# Patient Record
Sex: Male | Born: 1945
Health system: Southern US, Community
[De-identification: ages and names within clinical notes are randomized; demographics above are authoritative.]

## PROBLEM LIST (undated history)

## (undated) DIAGNOSIS — Z8719 Personal history of other diseases of the digestive system: Secondary | ICD-10-CM

## (undated) DIAGNOSIS — G8929 Other chronic pain: Secondary | ICD-10-CM

## (undated) DIAGNOSIS — K635 Polyp of colon: Secondary | ICD-10-CM

## (undated) DIAGNOSIS — E7800 Pure hypercholesterolemia, unspecified: Secondary | ICD-10-CM

## (undated) DIAGNOSIS — M1990 Unspecified osteoarthritis, unspecified site: Secondary | ICD-10-CM

## (undated) DIAGNOSIS — F419 Anxiety disorder, unspecified: Secondary | ICD-10-CM

## (undated) DIAGNOSIS — M109 Gout, unspecified: Secondary | ICD-10-CM

## (undated) DIAGNOSIS — G43109 Migraine with aura, not intractable, without status migrainosus: Secondary | ICD-10-CM

## (undated) DIAGNOSIS — M549 Dorsalgia, unspecified: Secondary | ICD-10-CM

## (undated) DIAGNOSIS — R011 Cardiac murmur, unspecified: Secondary | ICD-10-CM

## (undated) HISTORY — PX: MEDIAL COLLATERAL LIGAMENT REPAIR, ELBOW: SHX2018

## (undated) HISTORY — PX: FOOT SURGERY: SHX648

## (undated) HISTORY — PX: EYE SURGERY: SHX253

## (undated) HISTORY — PX: NASAL ENDOSCOPY: SHX6577

## (undated) HISTORY — PX: KNEE SURGERY: SHX244

## (undated) HISTORY — PX: OTHER SURGICAL HISTORY: SHX169

---

## 1996-09-27 HISTORY — PX: ROTATOR CUFF REPAIR: SHX139

## 1998-11-07 ENCOUNTER — Ambulatory Visit (HOSPITAL_COMMUNITY): Admission: RE | Admit: 1998-11-07 | Discharge: 1998-11-07 | Payer: Self-pay | Admitting: Gastroenterology

## 1999-09-15 ENCOUNTER — Ambulatory Visit (HOSPITAL_COMMUNITY): Admission: RE | Admit: 1999-09-15 | Discharge: 1999-09-15 | Payer: Self-pay | Admitting: Gastroenterology

## 1999-09-15 ENCOUNTER — Encounter: Payer: Self-pay | Admitting: Gastroenterology

## 2002-10-12 ENCOUNTER — Ambulatory Visit (HOSPITAL_COMMUNITY): Admission: RE | Admit: 2002-10-12 | Discharge: 2002-10-12 | Payer: Self-pay | Admitting: Gastroenterology

## 2007-10-05 ENCOUNTER — Encounter: Admission: RE | Admit: 2007-10-05 | Discharge: 2007-10-05 | Payer: Self-pay | Admitting: Gastroenterology

## 2007-10-11 ENCOUNTER — Encounter: Admission: RE | Admit: 2007-10-11 | Discharge: 2007-10-11 | Payer: Self-pay | Admitting: Surgery

## 2010-05-28 ENCOUNTER — Ambulatory Visit: Payer: Self-pay | Admitting: Cardiovascular Disease

## 2010-10-18 ENCOUNTER — Encounter: Payer: Self-pay | Admitting: Gastroenterology

## 2011-02-12 NOTE — Op Note (Signed)
   Drew Wyatt, COUNTERMAN                            ACCOUNT NO.:  [id number]   MEDICAL RECORD NO.:  [id number]                   PATIENT TYPE:  AMB   LOCATION:  ENDO                                 FACILITY:  [facility]   PHYSICIAN:  John C. Madilyn Fireman, M.D.                 DATE OF BIRTH:  08-09-1946   DATE OF PROCEDURE:  10/12/2002  DATE OF DISCHARGE:                                 OPERATIVE REPORT   PROCEDURE:  Colonoscopy.   INDICATIONS FOR PROCEDURE:  History of adenomatous colon polyps with last  colonoscopy 3 years ago.   DESCRIPTION OF PROCEDURE:  The patient was placed in the left lateral  decubitus position and placed on the pulse monitor with continuous low flow  oxygen delivered by nasal cannula. He was sedated with 100 micrograms of IV  Demerol and 10 mg of IV Versed.   The Olympus video colonoscope was inserted into the rectum and advanced to  the cecum, confirmed by transillumination of McBurney's point and  visualization of the ileocecal valve and appendiceal orifice.  The prep was  excellent.   The cecum, ascending , transverse, descending and sigmoid colon all appeared  normal with no masses, polyps, diverticula or other mucosal abnormalities.  The rectum likewise appeared normal. Retroflexed view of the anus revealed  no obvious internal hemorrhoids.   The colonoscope was then withdrawn and the patient was returned to the  recovery room in stable condition. He tolerated the procedure well and there  were no immediate complications.   IMPRESSION:  Normal colonoscopy.                                                  John C. Madilyn Fireman, M.D.    J[facility]/MEDQ  D:  10/12/2002  T:  10/12/2002  Job:  [id number]   cc:   Al Decant. Janey Greaser, M.D.  [address]  [place]  [place] [address]  Fax: [phone]

## 2011-04-13 ENCOUNTER — Other Ambulatory Visit: Payer: Self-pay | Admitting: Orthopedic Surgery

## 2011-04-13 DIAGNOSIS — M25561 Pain in right knee: Secondary | ICD-10-CM

## 2011-04-16 ENCOUNTER — Ambulatory Visit
Admission: RE | Admit: 2011-04-16 | Discharge: 2011-04-16 | Disposition: A | Discharge status: Home / Self Care | Payer: Medicare Other | Source: Ambulatory Visit | Attending: Orthopedic Surgery | Admitting: Orthopedic Surgery

## 2011-04-16 DIAGNOSIS — M25561 Pain in right knee: Secondary | ICD-10-CM

## 2011-09-26 ENCOUNTER — Ambulatory Visit (INDEPENDENT_AMBULATORY_CARE_PROVIDER_SITE_OTHER): Payer: Medicare Other

## 2011-09-26 DIAGNOSIS — J019 Acute sinusitis, unspecified: Secondary | ICD-10-CM

## 2011-10-01 DIAGNOSIS — R05 Cough: Secondary | ICD-10-CM | POA: Diagnosis not present

## 2011-10-13 DIAGNOSIS — H269 Unspecified cataract: Secondary | ICD-10-CM | POA: Diagnosis not present

## 2011-10-13 DIAGNOSIS — H5210 Myopia, unspecified eye: Secondary | ICD-10-CM | POA: Diagnosis not present

## 2011-12-08 DIAGNOSIS — D235 Other benign neoplasm of skin of trunk: Secondary | ICD-10-CM | POA: Diagnosis not present

## 2011-12-08 DIAGNOSIS — L821 Other seborrheic keratosis: Secondary | ICD-10-CM | POA: Diagnosis not present

## 2011-12-08 DIAGNOSIS — D213 Benign neoplasm of connective and other soft tissue of thorax: Secondary | ICD-10-CM | POA: Diagnosis not present

## 2012-02-08 DIAGNOSIS — Z0110 Encounter for examination of ears and hearing without abnormal findings: Secondary | ICD-10-CM | POA: Diagnosis not present

## 2012-04-20 DIAGNOSIS — N529 Male erectile dysfunction, unspecified: Secondary | ICD-10-CM | POA: Diagnosis not present

## 2012-04-20 DIAGNOSIS — G479 Sleep disorder, unspecified: Secondary | ICD-10-CM | POA: Diagnosis not present

## 2012-04-20 DIAGNOSIS — Z125 Encounter for screening for malignant neoplasm of prostate: Secondary | ICD-10-CM | POA: Diagnosis not present

## 2012-04-20 DIAGNOSIS — M109 Gout, unspecified: Secondary | ICD-10-CM | POA: Diagnosis not present

## 2012-04-20 DIAGNOSIS — F411 Generalized anxiety disorder: Secondary | ICD-10-CM | POA: Diagnosis not present

## 2012-04-20 DIAGNOSIS — E782 Mixed hyperlipidemia: Secondary | ICD-10-CM | POA: Diagnosis not present

## 2012-04-27 DIAGNOSIS — F411 Generalized anxiety disorder: Secondary | ICD-10-CM | POA: Diagnosis not present

## 2012-04-27 DIAGNOSIS — E782 Mixed hyperlipidemia: Secondary | ICD-10-CM | POA: Diagnosis not present

## 2012-04-27 DIAGNOSIS — G479 Sleep disorder, unspecified: Secondary | ICD-10-CM | POA: Diagnosis not present

## 2012-04-27 DIAGNOSIS — N529 Male erectile dysfunction, unspecified: Secondary | ICD-10-CM | POA: Diagnosis not present

## 2012-04-27 DIAGNOSIS — Z23 Encounter for immunization: Secondary | ICD-10-CM | POA: Diagnosis not present

## 2012-04-27 DIAGNOSIS — M109 Gout, unspecified: Secondary | ICD-10-CM | POA: Diagnosis not present

## 2012-04-27 DIAGNOSIS — Z0000 Encounter for general adult medical examination without abnormal findings: Secondary | ICD-10-CM | POA: Diagnosis not present

## 2012-05-09 DIAGNOSIS — K1370 Unspecified lesions of oral mucosa: Secondary | ICD-10-CM | POA: Diagnosis not present

## 2012-05-11 DIAGNOSIS — K1370 Unspecified lesions of oral mucosa: Secondary | ICD-10-CM | POA: Diagnosis not present

## 2012-06-07 DIAGNOSIS — Z23 Encounter for immunization: Secondary | ICD-10-CM | POA: Diagnosis not present

## 2012-10-11 DIAGNOSIS — D126 Benign neoplasm of colon, unspecified: Secondary | ICD-10-CM | POA: Diagnosis not present

## 2012-10-11 DIAGNOSIS — Z86010 Personal history of colonic polyps: Secondary | ICD-10-CM | POA: Diagnosis not present

## 2012-10-11 DIAGNOSIS — Z09 Encounter for follow-up examination after completed treatment for conditions other than malignant neoplasm: Secondary | ICD-10-CM | POA: Diagnosis not present

## 2012-10-19 ENCOUNTER — Encounter (HOSPITAL_COMMUNITY): Payer: Self-pay | Admitting: *Deleted

## 2012-10-19 ENCOUNTER — Emergency Department (HOSPITAL_COMMUNITY)
Admission: EM | Admit: 2012-10-19 | Discharge: 2012-10-19 | Disposition: A | Discharge status: Home / Self Care | Payer: Medicare Other | Attending: Emergency Medicine | Admitting: Emergency Medicine

## 2012-10-19 DIAGNOSIS — K625 Hemorrhage of anus and rectum: Secondary | ICD-10-CM | POA: Diagnosis not present

## 2012-10-19 DIAGNOSIS — Z79899 Other long term (current) drug therapy: Secondary | ICD-10-CM | POA: Insufficient documentation

## 2012-10-19 DIAGNOSIS — Z8739 Personal history of other diseases of the musculoskeletal system and connective tissue: Secondary | ICD-10-CM | POA: Diagnosis not present

## 2012-10-19 DIAGNOSIS — Z860100 Personal history of colon polyps, unspecified: Secondary | ICD-10-CM | POA: Insufficient documentation

## 2012-10-19 DIAGNOSIS — Z7982 Long term (current) use of aspirin: Secondary | ICD-10-CM | POA: Insufficient documentation

## 2012-10-19 DIAGNOSIS — E7800 Pure hypercholesterolemia, unspecified: Secondary | ICD-10-CM | POA: Insufficient documentation

## 2012-10-19 HISTORY — DX: Other chronic pain: G89.29

## 2012-10-19 HISTORY — DX: Polyp of colon: K63.5

## 2012-10-19 HISTORY — DX: Pure hypercholesterolemia, unspecified: E78.00

## 2012-10-19 HISTORY — DX: Dorsalgia, unspecified: M54.9

## 2012-10-19 LAB — CBC WITH DIFFERENTIAL/PLATELET
Basophils Absolute: 0 10*3/uL (ref 0.0–0.1)
Basophils Relative: 1 % (ref 0–1)
Eosinophils Relative: 2 % (ref 0–5)
Lymphocytes Relative: 24 % (ref 12–46)
MCHC: 33.8 g/dL (ref 30.0–36.0)
MCV: 89.7 fL (ref 78.0–100.0)
Neutro Abs: 4 10*3/uL (ref 1.7–7.7)
Platelets: 164 10*3/uL (ref 150–400)
RDW: 13.7 % (ref 11.5–15.5)
WBC: 6.3 10*3/uL (ref 4.0–10.5)

## 2012-10-19 LAB — COMPREHENSIVE METABOLIC PANEL
ALT: 39 U/L (ref 0–53)
AST: 34 U/L (ref 0–37)
Albumin: 4.1 g/dL (ref 3.5–5.2)
CO2: 30 mEq/L (ref 19–32)
Calcium: 10 mg/dL (ref 8.4–10.5)
GFR calc non Af Amer: 76 mL/min — ABNORMAL LOW (ref >90)
Sodium: 144 mEq/L (ref 135–145)
Total Protein: 7.3 g/dL (ref 6.0–8.3)

## 2012-10-19 NOTE — ED Notes (Signed)
Dr. Kizzie Bane called to speak to pt.

## 2012-10-19 NOTE — ED Provider Notes (Signed)
History     CSN: [id number]  Arrival date & time 10/19/12  1209   First MD Initiated Contact with Patient 10/19/12 1452      Chief Complaint  Patient presents with  . Rectal Bleeding    (Consider location/radiation/quality/duration/timing/severity/associated sxs/prior treatment) The history is provided by the patient.  Drew Wyatt is a 67 y.o. male here with rectal bleeding. He had colonoscopy done a week ago with Dr. Madilyn Fireman. Since yesterday, he noticed melana. Today, he had an episode of bright red blood per rectum. He called Dr. Madilyn Fireman, and was sent in for eval. No chest pain or SOB. Only on aspirin 81mg  daily. Not on coumadin or plavix. No ab pain no vomiting. No cardiac history.    Past Medical History  Diagnosis Date  . Colon polyps   . Chronic back pain   . Hypercholesteremia     Past Surgical History  Procedure Date  . Rotator cuff repair 1998  . Foot surgery     achylies tendon  . Medial collateral ligament repair, elbow   . Knee surgery     meniscus repair    No family history on file.  History  Substance Use Topics  . Smoking status: Never Smoker   . Smokeless tobacco: Not on file  . Alcohol Use: 4.2 oz/week    7 Glasses of wine per week      Review of Systems  Gastrointestinal: Positive for blood in stool.  All other systems reviewed and are negative.    Allergies  Review of patient's allergies indicates no known allergies.  Home Medications   Current Outpatient Rx  Name  Route  Sig  Dispense  Refill  . ALLOPURINOL 300 MG PO TABS   Oral   Take 300 mg by mouth daily.         . ASPIRIN EC 81 MG PO TBEC   Oral   Take 81 mg by mouth daily.         . ATORVASTATIN CALCIUM 40 MG PO TABS   Oral   Take 40 mg by mouth every evening.         Marland Kitchen CALCIUM CITRATE-VITAMIN D 250-200 MG-UNIT PO TABS   Oral   Take 1 tablet by mouth daily.         Marland Kitchen VITAMIN D 1000 UNITS PO TABS   Oral   Take 1,000 Units by mouth daily.         . OMEGA-3  FATTY ACIDS 1000 MG PO CAPS   Oral   Take 1 g by mouth daily.         Marland Kitchen FLUOXETINE HCL 20 MG PO CAPS   Oral   Take 20 mg by mouth every evening.           BP 137/80  Pulse 54  Temp 98.5 F (36.9 C) (Oral)  Resp 18  SpO2 100%  Physical Exam  Nursing note and vitals reviewed. Constitutional: He is oriented to person, place, and time. He appears well-developed and well-nourished.  HENT:  Head: Normocephalic.  Mouth/Throat: Oropharynx is clear and moist.  Eyes: Conjunctivae normal are normal. Pupils are equal, round, and reactive to light.  Neck: Normal range of motion. Neck supple.  Cardiovascular: Normal rate, regular rhythm and normal heart sounds.   Pulmonary/Chest: Effort normal. No respiratory distress. He has no wheezes. He has no rales.  Abdominal: Soft. Bowel sounds are normal. He exhibits no distension. There is no tenderness. There is no rebound.  Genitourinary:  Small external hemorrhoid, no thrombosed or tender. + minimal blood in vault but no clots or active bleeding.   Musculoskeletal: Normal range of motion.  Neurological: He is alert and oriented to person, place, and time.  Skin: Skin is warm and dry.  Psychiatric: He has a normal mood and affect. His behavior is normal. Judgment and thought content normal.    ED Course  Procedures (including critical care time)  Labs Reviewed  COMPREHENSIVE METABOLIC PANEL - Abnormal; Notable for the following:    GFR calc non Af Amer 76 (*)     GFR calc Af Amer 88 (*)     All other components within normal limits  CBC WITH DIFFERENTIAL   No results found.   1. Rectal bleeding       MDM  Drew Wyatt is a 67 y.o. male here with rectal bleeding. H/g stable. I called Dr. Madilyn Fireman and discussed care. He talked to the patient as well. Patient is hemodynamically stable and H/h stable so can be discharged. He will see Dr. Madilyn Fireman outpatient. I told him to stop taking aspirin for now. Return precautions given.          Richardean Canal, MD 10/19/12 1536

## 2012-10-19 NOTE — ED Notes (Signed)
Dr. Yao at bedside. 

## 2012-10-19 NOTE — ED Notes (Signed)
Pt had a colonoscopy Wed to remove 3 polyps.  Pt noticed black stool yesterday.  Today he again noticed "dark" stool and some bright red blood on tp/in toilet water.  Called Dr Dorena Cookey and was told to come to ER.  VS stable.  Pt denies abd pain.

## 2012-11-15 DIAGNOSIS — H5210 Myopia, unspecified eye: Secondary | ICD-10-CM | POA: Diagnosis not present

## 2012-11-15 DIAGNOSIS — H2510 Age-related nuclear cataract, unspecified eye: Secondary | ICD-10-CM | POA: Diagnosis not present

## 2012-12-27 DIAGNOSIS — L719 Rosacea, unspecified: Secondary | ICD-10-CM | POA: Diagnosis not present

## 2012-12-27 DIAGNOSIS — L570 Actinic keratosis: Secondary | ICD-10-CM | POA: Diagnosis not present

## 2012-12-27 DIAGNOSIS — L723 Sebaceous cyst: Secondary | ICD-10-CM | POA: Diagnosis not present

## 2012-12-27 DIAGNOSIS — D1739 Benign lipomatous neoplasm of skin and subcutaneous tissue of other sites: Secondary | ICD-10-CM | POA: Diagnosis not present

## 2012-12-27 DIAGNOSIS — D2370 Other benign neoplasm of skin of unspecified lower limb, including hip: Secondary | ICD-10-CM | POA: Diagnosis not present

## 2012-12-27 DIAGNOSIS — L821 Other seborrheic keratosis: Secondary | ICD-10-CM | POA: Diagnosis not present

## 2012-12-27 DIAGNOSIS — D239 Other benign neoplasm of skin, unspecified: Secondary | ICD-10-CM | POA: Diagnosis not present

## 2013-05-10 DIAGNOSIS — E782 Mixed hyperlipidemia: Secondary | ICD-10-CM | POA: Diagnosis not present

## 2013-05-10 DIAGNOSIS — M109 Gout, unspecified: Secondary | ICD-10-CM | POA: Diagnosis not present

## 2013-05-10 DIAGNOSIS — Z0000 Encounter for general adult medical examination without abnormal findings: Secondary | ICD-10-CM | POA: Diagnosis not present

## 2013-05-10 DIAGNOSIS — Z125 Encounter for screening for malignant neoplasm of prostate: Secondary | ICD-10-CM | POA: Diagnosis not present

## 2013-05-15 DIAGNOSIS — H0230 Blepharochalasis unspecified eye, unspecified eyelid: Secondary | ICD-10-CM | POA: Diagnosis not present

## 2013-05-17 DIAGNOSIS — N529 Male erectile dysfunction, unspecified: Secondary | ICD-10-CM | POA: Diagnosis not present

## 2013-05-17 DIAGNOSIS — Z86010 Personal history of colonic polyps: Secondary | ICD-10-CM | POA: Diagnosis not present

## 2013-05-17 DIAGNOSIS — G479 Sleep disorder, unspecified: Secondary | ICD-10-CM | POA: Diagnosis not present

## 2013-05-17 DIAGNOSIS — M109 Gout, unspecified: Secondary | ICD-10-CM | POA: Diagnosis not present

## 2013-05-17 DIAGNOSIS — Z1331 Encounter for screening for depression: Secondary | ICD-10-CM | POA: Diagnosis not present

## 2013-05-17 DIAGNOSIS — F411 Generalized anxiety disorder: Secondary | ICD-10-CM | POA: Diagnosis not present

## 2013-05-17 DIAGNOSIS — Z23 Encounter for immunization: Secondary | ICD-10-CM | POA: Diagnosis not present

## 2013-05-17 DIAGNOSIS — E782 Mixed hyperlipidemia: Secondary | ICD-10-CM | POA: Diagnosis not present

## 2013-05-17 DIAGNOSIS — Z0000 Encounter for general adult medical examination without abnormal findings: Secondary | ICD-10-CM | POA: Diagnosis not present

## 2013-06-21 DIAGNOSIS — Z23 Encounter for immunization: Secondary | ICD-10-CM | POA: Diagnosis not present

## 2013-11-28 DIAGNOSIS — H2510 Age-related nuclear cataract, unspecified eye: Secondary | ICD-10-CM | POA: Diagnosis not present

## 2013-11-28 DIAGNOSIS — H0230 Blepharochalasis unspecified eye, unspecified eyelid: Secondary | ICD-10-CM | POA: Diagnosis not present

## 2014-01-03 DIAGNOSIS — S7000XA Contusion of unspecified hip, initial encounter: Secondary | ICD-10-CM | POA: Diagnosis not present

## 2014-01-14 DIAGNOSIS — L820 Inflamed seborrheic keratosis: Secondary | ICD-10-CM | POA: Diagnosis not present

## 2014-01-14 DIAGNOSIS — L723 Sebaceous cyst: Secondary | ICD-10-CM | POA: Diagnosis not present

## 2014-01-14 DIAGNOSIS — D1801 Hemangioma of skin and subcutaneous tissue: Secondary | ICD-10-CM | POA: Diagnosis not present

## 2014-01-14 DIAGNOSIS — L570 Actinic keratosis: Secondary | ICD-10-CM | POA: Diagnosis not present

## 2014-01-14 DIAGNOSIS — D1739 Benign lipomatous neoplasm of skin and subcutaneous tissue of other sites: Secondary | ICD-10-CM | POA: Diagnosis not present

## 2014-01-14 DIAGNOSIS — L821 Other seborrheic keratosis: Secondary | ICD-10-CM | POA: Diagnosis not present

## 2014-04-11 ENCOUNTER — Encounter: Payer: Self-pay | Admitting: *Deleted

## 2014-05-01 DIAGNOSIS — F411 Generalized anxiety disorder: Secondary | ICD-10-CM | POA: Diagnosis not present

## 2014-05-20 DIAGNOSIS — Z23 Encounter for immunization: Secondary | ICD-10-CM | POA: Diagnosis not present

## 2014-05-20 DIAGNOSIS — Z0000 Encounter for general adult medical examination without abnormal findings: Secondary | ICD-10-CM | POA: Diagnosis not present

## 2014-05-20 DIAGNOSIS — N529 Male erectile dysfunction, unspecified: Secondary | ICD-10-CM | POA: Diagnosis not present

## 2014-05-20 DIAGNOSIS — M109 Gout, unspecified: Secondary | ICD-10-CM | POA: Diagnosis not present

## 2014-05-20 DIAGNOSIS — F411 Generalized anxiety disorder: Secondary | ICD-10-CM | POA: Diagnosis not present

## 2014-05-20 DIAGNOSIS — E782 Mixed hyperlipidemia: Secondary | ICD-10-CM | POA: Diagnosis not present

## 2014-05-20 DIAGNOSIS — G479 Sleep disorder, unspecified: Secondary | ICD-10-CM | POA: Diagnosis not present

## 2014-05-20 DIAGNOSIS — Z125 Encounter for screening for malignant neoplasm of prostate: Secondary | ICD-10-CM | POA: Diagnosis not present

## 2014-05-21 DIAGNOSIS — F411 Generalized anxiety disorder: Secondary | ICD-10-CM | POA: Diagnosis not present

## 2014-05-21 DIAGNOSIS — N529 Male erectile dysfunction, unspecified: Secondary | ICD-10-CM | POA: Diagnosis not present

## 2014-05-21 DIAGNOSIS — R7301 Impaired fasting glucose: Secondary | ICD-10-CM | POA: Diagnosis not present

## 2014-05-21 DIAGNOSIS — Z86010 Personal history of colonic polyps: Secondary | ICD-10-CM | POA: Diagnosis not present

## 2014-05-21 DIAGNOSIS — E782 Mixed hyperlipidemia: Secondary | ICD-10-CM | POA: Diagnosis not present

## 2014-05-21 DIAGNOSIS — Z1331 Encounter for screening for depression: Secondary | ICD-10-CM | POA: Diagnosis not present

## 2014-05-21 DIAGNOSIS — M109 Gout, unspecified: Secondary | ICD-10-CM | POA: Diagnosis not present

## 2014-05-21 DIAGNOSIS — Z0000 Encounter for general adult medical examination without abnormal findings: Secondary | ICD-10-CM | POA: Diagnosis not present

## 2014-06-27 DIAGNOSIS — H2513 Age-related nuclear cataract, bilateral: Secondary | ICD-10-CM | POA: Diagnosis not present

## 2014-07-04 DIAGNOSIS — I10 Essential (primary) hypertension: Secondary | ICD-10-CM | POA: Diagnosis not present

## 2014-07-04 DIAGNOSIS — Z7982 Long term (current) use of aspirin: Secondary | ICD-10-CM | POA: Diagnosis not present

## 2014-07-04 DIAGNOSIS — H25811 Combined forms of age-related cataract, right eye: Secondary | ICD-10-CM | POA: Diagnosis not present

## 2014-07-04 DIAGNOSIS — H269 Unspecified cataract: Secondary | ICD-10-CM | POA: Diagnosis not present

## 2014-07-04 DIAGNOSIS — H25812 Combined forms of age-related cataract, left eye: Secondary | ICD-10-CM | POA: Diagnosis not present

## 2014-07-18 DIAGNOSIS — Z7982 Long term (current) use of aspirin: Secondary | ICD-10-CM | POA: Diagnosis not present

## 2014-07-18 DIAGNOSIS — H25812 Combined forms of age-related cataract, left eye: Secondary | ICD-10-CM | POA: Diagnosis not present

## 2014-07-18 DIAGNOSIS — H25811 Combined forms of age-related cataract, right eye: Secondary | ICD-10-CM | POA: Diagnosis not present

## 2014-07-18 DIAGNOSIS — E038 Other specified hypothyroidism: Secondary | ICD-10-CM | POA: Diagnosis not present

## 2014-07-18 DIAGNOSIS — I10 Essential (primary) hypertension: Secondary | ICD-10-CM | POA: Diagnosis not present

## 2014-07-26 DIAGNOSIS — Z9841 Cataract extraction status, right eye: Secondary | ICD-10-CM | POA: Diagnosis not present

## 2014-07-26 DIAGNOSIS — Z48810 Encounter for surgical aftercare following surgery on the sense organs: Secondary | ICD-10-CM | POA: Diagnosis not present

## 2014-07-26 DIAGNOSIS — Z9842 Cataract extraction status, left eye: Secondary | ICD-10-CM | POA: Diagnosis not present

## 2014-08-26 DIAGNOSIS — M25552 Pain in left hip: Secondary | ICD-10-CM | POA: Diagnosis not present

## 2014-11-08 DIAGNOSIS — Z961 Presence of intraocular lens: Secondary | ICD-10-CM | POA: Diagnosis not present

## 2014-11-26 DIAGNOSIS — G629 Polyneuropathy, unspecified: Secondary | ICD-10-CM | POA: Diagnosis not present

## 2014-11-26 DIAGNOSIS — R202 Paresthesia of skin: Secondary | ICD-10-CM | POA: Diagnosis not present

## 2014-11-26 DIAGNOSIS — R7301 Impaired fasting glucose: Secondary | ICD-10-CM | POA: Diagnosis not present

## 2014-12-06 DIAGNOSIS — H26493 Other secondary cataract, bilateral: Secondary | ICD-10-CM | POA: Diagnosis not present

## 2014-12-19 DIAGNOSIS — Z23 Encounter for immunization: Secondary | ICD-10-CM | POA: Diagnosis not present

## 2015-01-16 DIAGNOSIS — D1801 Hemangioma of skin and subcutaneous tissue: Secondary | ICD-10-CM | POA: Diagnosis not present

## 2015-01-16 DIAGNOSIS — L821 Other seborrheic keratosis: Secondary | ICD-10-CM | POA: Diagnosis not present

## 2015-01-16 DIAGNOSIS — L218 Other seborrheic dermatitis: Secondary | ICD-10-CM | POA: Diagnosis not present

## 2015-01-16 DIAGNOSIS — D1721 Benign lipomatous neoplasm of skin and subcutaneous tissue of right arm: Secondary | ICD-10-CM | POA: Diagnosis not present

## 2015-01-16 DIAGNOSIS — D225 Melanocytic nevi of trunk: Secondary | ICD-10-CM | POA: Diagnosis not present

## 2015-01-16 DIAGNOSIS — D171 Benign lipomatous neoplasm of skin and subcutaneous tissue of trunk: Secondary | ICD-10-CM | POA: Diagnosis not present

## 2015-01-21 DIAGNOSIS — H43813 Vitreous degeneration, bilateral: Secondary | ICD-10-CM | POA: Diagnosis not present

## 2015-06-12 DIAGNOSIS — F419 Anxiety disorder, unspecified: Secondary | ICD-10-CM | POA: Diagnosis not present

## 2015-06-12 DIAGNOSIS — N529 Male erectile dysfunction, unspecified: Secondary | ICD-10-CM | POA: Diagnosis not present

## 2015-06-12 DIAGNOSIS — Z86010 Personal history of colonic polyps: Secondary | ICD-10-CM | POA: Diagnosis not present

## 2015-06-12 DIAGNOSIS — R7301 Impaired fasting glucose: Secondary | ICD-10-CM | POA: Diagnosis not present

## 2015-06-12 DIAGNOSIS — Z125 Encounter for screening for malignant neoplasm of prostate: Secondary | ICD-10-CM | POA: Diagnosis not present

## 2015-06-12 DIAGNOSIS — E782 Mixed hyperlipidemia: Secondary | ICD-10-CM | POA: Diagnosis not present

## 2015-06-12 DIAGNOSIS — G629 Polyneuropathy, unspecified: Secondary | ICD-10-CM | POA: Diagnosis not present

## 2015-06-12 DIAGNOSIS — M1000 Idiopathic gout, unspecified site: Secondary | ICD-10-CM | POA: Diagnosis not present

## 2015-06-17 DIAGNOSIS — H6120 Impacted cerumen, unspecified ear: Secondary | ICD-10-CM | POA: Diagnosis not present

## 2015-06-17 DIAGNOSIS — N529 Male erectile dysfunction, unspecified: Secondary | ICD-10-CM | POA: Diagnosis not present

## 2015-06-17 DIAGNOSIS — R7301 Impaired fasting glucose: Secondary | ICD-10-CM | POA: Diagnosis not present

## 2015-06-17 DIAGNOSIS — Z125 Encounter for screening for malignant neoplasm of prostate: Secondary | ICD-10-CM | POA: Diagnosis not present

## 2015-06-17 DIAGNOSIS — Z1389 Encounter for screening for other disorder: Secondary | ICD-10-CM | POA: Diagnosis not present

## 2015-06-17 DIAGNOSIS — Z86010 Personal history of colonic polyps: Secondary | ICD-10-CM | POA: Diagnosis not present

## 2015-06-17 DIAGNOSIS — Z23 Encounter for immunization: Secondary | ICD-10-CM | POA: Diagnosis not present

## 2015-06-17 DIAGNOSIS — E782 Mixed hyperlipidemia: Secondary | ICD-10-CM | POA: Diagnosis not present

## 2015-06-17 DIAGNOSIS — Z0000 Encounter for general adult medical examination without abnormal findings: Secondary | ICD-10-CM | POA: Diagnosis not present

## 2015-06-17 DIAGNOSIS — M1000 Idiopathic gout, unspecified site: Secondary | ICD-10-CM | POA: Diagnosis not present

## 2015-06-17 DIAGNOSIS — F419 Anxiety disorder, unspecified: Secondary | ICD-10-CM | POA: Diagnosis not present

## 2015-06-17 DIAGNOSIS — Z7189 Other specified counseling: Secondary | ICD-10-CM | POA: Diagnosis not present

## 2015-06-20 DIAGNOSIS — H43813 Vitreous degeneration, bilateral: Secondary | ICD-10-CM | POA: Diagnosis not present

## 2015-06-20 DIAGNOSIS — Z961 Presence of intraocular lens: Secondary | ICD-10-CM | POA: Diagnosis not present

## 2015-08-26 DIAGNOSIS — M799 Soft tissue disorder, unspecified: Secondary | ICD-10-CM | POA: Diagnosis not present

## 2015-08-27 ENCOUNTER — Other Ambulatory Visit: Payer: Self-pay

## 2015-08-27 DIAGNOSIS — R002 Palpitations: Secondary | ICD-10-CM

## 2015-08-27 DIAGNOSIS — R000 Tachycardia, unspecified: Secondary | ICD-10-CM

## 2015-09-02 DIAGNOSIS — K635 Polyp of colon: Secondary | ICD-10-CM | POA: Insufficient documentation

## 2015-09-02 DIAGNOSIS — E7800 Pure hypercholesterolemia, unspecified: Secondary | ICD-10-CM | POA: Insufficient documentation

## 2015-09-02 DIAGNOSIS — M549 Dorsalgia, unspecified: Secondary | ICD-10-CM

## 2015-09-02 DIAGNOSIS — G8929 Other chronic pain: Secondary | ICD-10-CM | POA: Insufficient documentation

## 2015-09-03 ENCOUNTER — Ambulatory Visit: Payer: Self-pay | Admitting: Surgery

## 2015-09-04 ENCOUNTER — Ambulatory Visit (INDEPENDENT_AMBULATORY_CARE_PROVIDER_SITE_OTHER): Payer: Medicare Other

## 2015-09-04 ENCOUNTER — Encounter: Payer: Medicare Other | Admitting: Cardiovascular Disease

## 2015-09-04 DIAGNOSIS — R002 Palpitations: Secondary | ICD-10-CM

## 2015-09-04 DIAGNOSIS — R000 Tachycardia, unspecified: Secondary | ICD-10-CM

## 2015-09-04 LAB — EXERCISE TOLERANCE TEST
CSEPED: 11 min
CSEPEDS: 0 s
CSEPPHR: 160 {beats}/min
Estimated workload: 13.4 METS
MPHR: 150 {beats}/min
Percent HR: 106 %
RPE: 14
Rest HR: 59 {beats}/min

## 2015-09-04 NOTE — Patient Instructions (Addendum)
Mya Caserta  09/04/2015   Your procedure is scheduled on: 09-09-15  Report to [facility] Main  Entrance take [facility]  elevators to 3rd floor to  [place] at 730 AM.  Call this number if you have problems the morning of surgery [phone]   Remember: ONLY 1 PERSON MAY GO WITH YOU TO SHORT STAY TO GET  READY MORNING OF [place].  Do not eat food or drink liquids :After Midnight.     Take these medicines the morning of surgery with A SIP OF WATER: Allopurinol. Do not take 81 mg aspirin tonight, melatonin ok to take                               You may not have any metal on your body including hair pins and              piercings  Do not wear jewelry, make-up, lotions, powders or perfumes, deodorant             Do not wear nail polish.  Do not shave  48 hours prior to surgery.              Men may shave face and neck.   Do not bring valuables to the hospital. [place].  Contacts, dentures or bridgework may not be worn into surgery.  Leave suitcase in the car. After surgery it may be brought to your room.     Patients discharged the day of surgery will not be allowed to drive home.  Name and phone number of your driver:wife jackie cell [phone]  Special Instructions: N/A              Please read over the following fact sheets you were given: _____________________________________________________________________             [facility] - Preparing for Surgery Before surgery, you can play an important role.  Because skin is not sterile, your skin needs to be as free of germs as possible.  You can reduce the number of germs on your skin by washing with CHG (chlorahexidine gluconate) soap before surgery.  CHG is an antiseptic cleaner which kills germs and bonds with the skin to continue killing germs even after washing. Please DO NOT use if you have an allergy to CHG or antibacterial soaps.  If  your skin becomes reddened/irritated stop using the CHG and inform your nurse when you arrive at Short Stay. Do not shave (including legs and underarms) for at least 48 hours prior to the first CHG shower.  You may shave your face/neck. Please follow these instructions carefully:  1.  Shower with CHG Soap the night before surgery and the  morning of Surgery.  2.  If you choose to wash your hair, wash your hair first as usual with your  normal  shampoo.  3.  After you shampoo, rinse your hair and body thoroughly to remove the  shampoo.                           4.  Use CHG as you would any other liquid soap.  You can apply chg directly  to the skin and  wash                       Gently with a scrungie or clean washcloth.  5.  Apply the CHG Soap to your body ONLY FROM THE NECK DOWN.   Do not use on face/ open                           Wound or open sores. Avoid contact with eyes, ears mouth and genitals (private parts).                       Wash face,  Genitals (private parts) with your normal soap.             6.  Wash thoroughly, paying special attention to the area where your surgery  will be performed.  7.  Thoroughly rinse your body with warm water from the neck down.  8.  DO NOT shower/wash with your normal soap after using and rinsing off  the CHG Soap.                9.  Pat yourself dry with a clean towel.            10.  Wear clean pajamas.            11.  Place clean sheets on your bed the night of your first shower and do not  sleep with pets. Day of Surgery : Do not apply any lotions/deodorants the morning of surgery.  Please wear clean clothes to the hospital/surgery center.  FAILURE TO FOLLOW THESE INSTRUCTIONS MAY RESULT IN THE CANCELLATION OF YOUR SURGERY PATIENT SIGNATURE_________________________________  NURSE SIGNATURE__________________________________  ________________________________________________________________________

## 2015-09-08 ENCOUNTER — Encounter (HOSPITAL_COMMUNITY): Payer: Self-pay | Admitting: Surgery

## 2015-09-08 ENCOUNTER — Encounter (HOSPITAL_COMMUNITY): Payer: Self-pay

## 2015-09-08 ENCOUNTER — Encounter (HOSPITAL_COMMUNITY)
Admission: RE | Admit: 2015-09-08 | Discharge: 2015-09-08 | Disposition: A | Discharge status: Home / Self Care | Payer: Medicare Other | Source: Ambulatory Visit | Attending: Surgery | Admitting: Surgery

## 2015-09-08 DIAGNOSIS — Z9842 Cataract extraction status, left eye: Secondary | ICD-10-CM | POA: Diagnosis not present

## 2015-09-08 DIAGNOSIS — Z9841 Cataract extraction status, right eye: Secondary | ICD-10-CM | POA: Diagnosis not present

## 2015-09-08 DIAGNOSIS — D3612 Benign neoplasm of peripheral nerves and autonomic nervous system, upper limb, including shoulder: Secondary | ICD-10-CM | POA: Diagnosis not present

## 2015-09-08 DIAGNOSIS — Z7982 Long term (current) use of aspirin: Secondary | ICD-10-CM | POA: Diagnosis not present

## 2015-09-08 DIAGNOSIS — M7989 Other specified soft tissue disorders: Secondary | ICD-10-CM | POA: Diagnosis present

## 2015-09-08 DIAGNOSIS — Z79899 Other long term (current) drug therapy: Secondary | ICD-10-CM | POA: Diagnosis not present

## 2015-09-08 DIAGNOSIS — Z87891 Personal history of nicotine dependence: Secondary | ICD-10-CM | POA: Diagnosis not present

## 2015-09-08 DIAGNOSIS — E7800 Pure hypercholesterolemia, unspecified: Secondary | ICD-10-CM | POA: Diagnosis not present

## 2015-09-08 DIAGNOSIS — R2231 Localized swelling, mass and lump, right upper limb: Secondary | ICD-10-CM | POA: Diagnosis present

## 2015-09-08 DIAGNOSIS — D171 Benign lipomatous neoplasm of skin and subcutaneous tissue of trunk: Secondary | ICD-10-CM | POA: Diagnosis not present

## 2015-09-08 LAB — HEMOGLOBIN: Hemoglobin: 14.7 g/dL (ref 13.0–17.0)

## 2015-09-08 NOTE — H&P (Signed)
General Surgery [facility]  Drew Wyatt DOB: [birth year] Married / Language: English / Race: White Male  History of Present Illness Patient words: lipoma.  The patient is a 69 year old male who presents with a soft tissue mass.  Patient is self-referred. Patient's primary care physician is Dr. Cari Caraway. Patient returns to my practice after nearly an 8 year absence. He was evaluated previously for a soft tissue mass involving the left latissimus dorsi muscle. Patient underwent MRI scan in January 2009. This demonstrated a septated soft tissue mass which was intramuscular in the left latissimus dorsi muscle. At that time it measured 8.8 cm in greatest dimension. It was felt to represent a benign lipoma but a low-grade liposarcoma could not be excluded. Patient was told to return if he develops pain or if the mass increased at all in size. Mass has gradually increased in size over the past 8 years. He has not had any discomfort. Patient recently decided to return for surgical evaluation and management. Patient has not developed any other lesions. He has had no other significant medical problems in the interim.  Other Problems Anxiety Disorder Heart murmur Hypercholesterolemia  Past Surgical History Cataract Surgery Bilateral. Colon Polyp Removal - Colonoscopy Knee Surgery Right. Shoulder Surgery Right.  Diagnostic Studies History Colonoscopy 1-5 years ago  Allergies No Known Drug Allergies11/29/2016  Medication History Atorvastatin Calcium (40MG  Tablet, Oral) Active. Allopurinol (300MG  Tablet, Oral) Active. Colcrys (0.6MG  Tablet, Oral) Active. Viagra (50MG  Tablet, Oral) Active. Aspirin (81MG  Tablet DR, Oral) Active. Fish Oil (1000MG  Capsule DR, Oral) Active. Vitamin D (1000UNIT Tablet, Oral) Active. Calcium Citrate + D (250-200MG -UNIT Tablet, Oral) Active. Melatonin (5MG  Tablet, Oral) Active. (6mg ) Melatonin (1MG   Tablet, Oral) Active. (6mg ) Medications Reconciled  Social History Alcohol use Moderate alcohol use. Caffeine use Coffee, Tea. No drug use Tobacco use Former smoker.  Family History Breast Cancer Mother. Colon Polyps Brother. Respiratory Condition Father.  History Addendum Note Patient also notes a soft tissue mass on the ventral surface of the right forearm which is been present for some years. It has been followed by his dermatologist. It causes him some discomfort. He would like to have it removed concurrently.  Review of Systems Claiborne Billings [facility] LPN; [phone] PM) General Not Present- Appetite Loss, Chills, Fatigue, Fever, Night Sweats, Weight Gain and Weight Loss. Skin Not Present- Change in Wart/Mole, Dryness, Hives, Jaundice, New Lesions, Non-Healing Wounds, Rash and Ulcer. HEENT Not Present- Earache, Hearing Loss, Hoarseness, Nose Bleed, Oral Ulcers, Ringing in the Ears, Seasonal Allergies, Sinus Pain, Sore Throat, Visual Disturbances, Wears glasses/contact lenses and Yellow Eyes. Respiratory Not Present- Bloody sputum, Chronic Cough, Difficulty Breathing, Snoring and Wheezing. Breast Not Present- Breast Mass, Breast Pain, Nipple Discharge and Skin Changes. Cardiovascular Not Present- Chest Pain, Difficulty Breathing Lying Down, Leg Cramps, Palpitations, Rapid Heart Rate, Shortness of Breath and Swelling of Extremities. Gastrointestinal Not Present- Abdominal Pain, Bloating, Bloody Stool, Change in Bowel Habits, Chronic diarrhea, Constipation, Difficulty Swallowing, Excessive gas, Gets full quickly at meals, Hemorrhoids, Indigestion, Nausea, Rectal Pain and Vomiting. Musculoskeletal Not Present- Back Pain, Joint Pain, Joint Stiffness, Muscle Pain, Muscle Weakness and Swelling of Extremities. Neurological Not Present- Decreased Memory, Fainting, Headaches, Numbness, Seizures, Tingling, Tremor, Trouble walking and Weakness. Psychiatric Not Present- Anxiety, Bipolar,  Change in Sleep Pattern, Depression, Fearful and Frequent crying. Endocrine Not Present- Cold Intolerance, Excessive Hunger, Hair Changes, Heat Intolerance, Hot flashes and New Diabetes. Hematology Not Present- Easy Bruising, Excessive bleeding, Gland problems, HIV and Persistent Infections.  Vitals  Weight: 186.6 lb Height: 71in Body Surface Area: 2.05 m Body Mass Index: 26.03 kg/m  Temp.: 98.58F(Oral)  Pulse: 55 (Regular)  BP: 124/76 (Sitting, Left Arm, Standard)  Physical Exam  General - appears comfortable, no distress; not diaphorectic  HEENT - normocephalic; sclerae clear, gaze conjugate; mucous membranes moist, dentition good; voice normal  Neck - symmetric on extension; no palpable anterior or posterior cervical adenopathy; no palpable masses in the thyroid bed  Chest - clear bilaterally without rhonchi, rales, or wheeze; in the left flank there is an obvious soft tissue mass measuring at least 12 cm or greater in maximum dimension, well demarcated, mobile, nontender; with contraction of the left latissimus dorsi muscle it does appear that the mass is submuscular; there are no cutaneous changes  Cor - regular rhythm with normal rate; no significant murmur  Abd - soft without distension  Ext - non-tender without significant edema or lymphedema  Neuro - grossly intact; no tremor  Addendum Note On the ventral surface of the right forearm is a soft tissue mass which appears by lobar and measures approximately 2 cm in size. This is subcutaneous. It is mildly tender. Patient would like to have it excised concurrently.    Assessment & Plan   SOFT TISSUE MASS (M79.9)  Patient presents with an enlarging soft tissue mass of the left flank. Patient had previously been evaluated for this lesion 8 years ago. MRI scan was performed at that time. Patient has noted gradual enlargement in the interim. He remains asymptomatic. He has not developed any additional lesions.  He would like to proceed with surgical excision for management and definitive diagnosis.  We discussed surgical excision. We discussed the possibility of seroma formation or possible need for a drain following the procedure. We will submit the entire mass to pathology for review. I feel this is likely a benign process.  The risks and benefits of the procedure have been discussed at length with the patient. The patient understands the proposed procedure, potential alternative treatments, and the course of recovery to be expected. All of the patient's questions have been answered at this time. The patient wishes to proceed with surgery.  Earnstine Regal, MD, [facility] Office: [phone]

## 2015-09-08 NOTE — Progress Notes (Signed)
Exercise tolerance test with resting ekg 09-04-15 epic

## 2015-09-09 ENCOUNTER — Ambulatory Visit (HOSPITAL_COMMUNITY)
Admission: RE | Admit: 2015-09-09 | Discharge: 2015-09-09 | Disposition: A | Discharge status: Home / Self Care | Payer: Medicare Other | Source: Ambulatory Visit | Attending: Surgery | Admitting: Surgery

## 2015-09-09 ENCOUNTER — Ambulatory Visit (HOSPITAL_COMMUNITY): Payer: Medicare Other | Admitting: Anesthesiology

## 2015-09-09 ENCOUNTER — Encounter (HOSPITAL_COMMUNITY)
Admission: RE | Disposition: A | Discharge status: Home / Self Care | Payer: Self-pay | Source: Ambulatory Visit | Attending: Surgery

## 2015-09-09 ENCOUNTER — Encounter (HOSPITAL_COMMUNITY): Payer: Self-pay | Admitting: *Deleted

## 2015-09-09 DIAGNOSIS — Z87891 Personal history of nicotine dependence: Secondary | ICD-10-CM | POA: Insufficient documentation

## 2015-09-09 DIAGNOSIS — E7800 Pure hypercholesterolemia, unspecified: Secondary | ICD-10-CM | POA: Diagnosis not present

## 2015-09-09 DIAGNOSIS — Z9841 Cataract extraction status, right eye: Secondary | ICD-10-CM | POA: Insufficient documentation

## 2015-09-09 DIAGNOSIS — D3612 Benign neoplasm of peripheral nerves and autonomic nervous system, upper limb, including shoulder: Secondary | ICD-10-CM | POA: Diagnosis not present

## 2015-09-09 DIAGNOSIS — D171 Benign lipomatous neoplasm of skin and subcutaneous tissue of trunk: Secondary | ICD-10-CM | POA: Diagnosis not present

## 2015-09-09 DIAGNOSIS — R2231 Localized swelling, mass and lump, right upper limb: Secondary | ICD-10-CM | POA: Diagnosis not present

## 2015-09-09 DIAGNOSIS — R222 Localized swelling, mass and lump, trunk: Secondary | ICD-10-CM | POA: Diagnosis not present

## 2015-09-09 DIAGNOSIS — Z79899 Other long term (current) drug therapy: Secondary | ICD-10-CM | POA: Diagnosis not present

## 2015-09-09 DIAGNOSIS — Z7982 Long term (current) use of aspirin: Secondary | ICD-10-CM | POA: Insufficient documentation

## 2015-09-09 DIAGNOSIS — Z9842 Cataract extraction status, left eye: Secondary | ICD-10-CM | POA: Diagnosis not present

## 2015-09-09 DIAGNOSIS — M7989 Other specified soft tissue disorders: Secondary | ICD-10-CM | POA: Diagnosis present

## 2015-09-09 DIAGNOSIS — D2111 Benign neoplasm of connective and other soft tissue of right upper limb, including shoulder: Secondary | ICD-10-CM | POA: Diagnosis not present

## 2015-09-09 HISTORY — PX: MASS EXCISION: SHX2000

## 2015-09-09 HISTORY — PX: EXCISION MASS UPPER EXTREMETIES: SHX6704

## 2015-09-09 SURGERY — EXCISION MASS
Anesthesia: General | Site: Flank | Laterality: Right

## 2015-09-09 MED ORDER — LACTATED RINGERS IV SOLN: INTRAVENOUS | Status: DC

## 2015-09-09 MED ORDER — CEFAZOLIN SODIUM-DEXTROSE 2-3 GM-% IV SOLR
INTRAVENOUS | Status: AC
  Filled 2015-09-09: qty 50

## 2015-09-09 MED ORDER — CEFAZOLIN SODIUM-DEXTROSE 2-3 GM-% IV SOLR
2.0000 g | INTRAVENOUS | Status: AC
  Administered 2015-09-09: 2 g via INTRAVENOUS

## 2015-09-09 MED ORDER — FENTANYL CITRATE (PF) 100 MCG/2ML IJ SOLN
INTRAMUSCULAR | Status: DC | PRN
  Administered 2015-09-09: 100 ug via INTRAVENOUS
  Administered 2015-09-09 (×3): 50 ug via INTRAVENOUS

## 2015-09-09 MED ORDER — FENTANYL CITRATE (PF) 100 MCG/2ML IJ SOLN: 25.0000 ug | INTRAMUSCULAR | Status: DC | PRN

## 2015-09-09 MED ORDER — LIDOCAINE HCL (CARDIAC) 20 MG/ML IV SOLN
INTRAVENOUS | Status: DC | PRN
  Administered 2015-09-09: 50 mg via INTRAVENOUS

## 2015-09-09 MED ORDER — HYDROCODONE-ACETAMINOPHEN 5-325 MG PO TABS: 1.0000 | ORAL_TABLET | ORAL | Status: DC | PRN

## 2015-09-09 MED ORDER — LACTATED RINGERS IV SOLN
INTRAVENOUS | Status: DC | PRN
  Administered 2015-09-09: 09:00:00 via INTRAVENOUS

## 2015-09-09 MED ORDER — FENTANYL CITRATE (PF) 250 MCG/5ML IJ SOLN
INTRAMUSCULAR | Status: AC
  Filled 2015-09-09: qty 5

## 2015-09-09 MED ORDER — NEOSTIGMINE METHYLSULFATE 10 MG/10ML IV SOLN
INTRAVENOUS | Status: DC | PRN
  Administered 2015-09-09: 3.5 mg via INTRAVENOUS

## 2015-09-09 MED ORDER — EPHEDRINE SULFATE 50 MG/ML IJ SOLN
INTRAMUSCULAR | Status: DC | PRN
  Administered 2015-09-09: 5 mg via INTRAVENOUS

## 2015-09-09 MED ORDER — BUPIVACAINE HCL (PF) 0.25 % IJ SOLN
INTRAMUSCULAR | Status: DC | PRN
  Administered 2015-09-09: 15 mL

## 2015-09-09 MED ORDER — PROPOFOL 10 MG/ML IV BOLUS
INTRAVENOUS | Status: DC | PRN
Start: 2015-09-09 — End: 2015-09-09
  Administered 2015-09-09: 150 mg via INTRAVENOUS

## 2015-09-09 MED ORDER — PROPOFOL 10 MG/ML IV BOLUS
INTRAVENOUS | Status: AC
  Filled 2015-09-09: qty 20

## 2015-09-09 MED ORDER — GLYCOPYRROLATE 0.2 MG/ML IJ SOLN
INTRAMUSCULAR | Status: DC | PRN
  Administered 2015-09-09: .6 mg via INTRAVENOUS

## 2015-09-09 MED ORDER — ROCURONIUM BROMIDE 100 MG/10ML IV SOLN
INTRAVENOUS | Status: DC | PRN
  Administered 2015-09-09: 40 mg via INTRAVENOUS
  Administered 2015-09-09: 10 mg via INTRAVENOUS

## 2015-09-09 MED ORDER — BUPIVACAINE HCL (PF) 0.25 % IJ SOLN
INTRAMUSCULAR | Status: AC
  Filled 2015-09-09: qty 30

## 2015-09-09 MED ORDER — ONDANSETRON HCL 4 MG/2ML IJ SOLN
INTRAMUSCULAR | Status: DC | PRN
  Administered 2015-09-09: 4 mg via INTRAVENOUS

## 2015-09-09 SURGICAL SUPPLY — 34 items
BENZOIN TINCTURE PRP APPL 2/3 (GAUZE/BANDAGES/DRESSINGS) ×6 IMPLANT
BLADE HEX COATED 2.75 (ELECTRODE) ×3 IMPLANT
BLADE SURG SZ10 CARB STEEL (BLADE) ×3 IMPLANT
COVER SURGICAL LIGHT HANDLE (MISCELLANEOUS) ×3 IMPLANT
DECANTER SPIKE VIAL GLASS SM (MISCELLANEOUS) IMPLANT
DRAIN CHANNEL RND F F (WOUND CARE) IMPLANT
DRAPE LAPAROTOMY T 102X78X121 (DRAPES) ×3 IMPLANT
DRAPE LAPAROTOMY TRNSV 102X78 (DRAPE) IMPLANT
DRAPE SHEET LG 3/4 BI-LAMINATE (DRAPES) IMPLANT
DRSG TEGADERM 2-3/8X2-3/4 SM (GAUZE/BANDAGES/DRESSINGS) ×3 IMPLANT
DRSG TEGADERM 4X4.75 (GAUZE/BANDAGES/DRESSINGS) ×3 IMPLANT
ELECT REM PT RETURN 9FT ADLT (ELECTROSURGICAL) ×3
ELECTRODE REM PT RTRN 9FT ADLT (ELECTROSURGICAL) ×2 IMPLANT
EVACUATOR SILICONE 100CC (DRAIN) IMPLANT
GAUZE SPONGE 4X4 12PLY STRL (GAUZE/BANDAGES/DRESSINGS) ×6 IMPLANT
GLOVE BIOGEL PI IND STRL 7.0 (GLOVE) ×2 IMPLANT
GLOVE BIOGEL PI INDICATOR 7.0 (GLOVE) ×1
GLOVE SURG ORTHO 8.0 STRL STRW (GLOVE) ×3 IMPLANT
GOWN STRL REUS W/TWL LRG LVL3 (GOWN DISPOSABLE) ×3 IMPLANT
GOWN STRL REUS W/TWL XL LVL3 (GOWN DISPOSABLE) ×6 IMPLANT
KIT BASIN OR (CUSTOM PROCEDURE TRAY) ×3 IMPLANT
NEEDLE HYPO 25X1 1.5 SAFETY (NEEDLE) ×3 IMPLANT
NS IRRIG 1000ML POUR BTL (IV SOLUTION) ×3 IMPLANT
PACK GENERAL/GYN (CUSTOM PROCEDURE TRAY) ×3 IMPLANT
PEN SKIN MARKING BROAD (MISCELLANEOUS) IMPLANT
SPONGE LAP 18X18 X RAY DECT (DISPOSABLE) ×3 IMPLANT
STAPLER VISISTAT 35W (STAPLE) IMPLANT
STRIP CLOSURE SKIN 1/2X4 (GAUZE/BANDAGES/DRESSINGS) ×6 IMPLANT
SUT ETHILON 3 0 PS 1 (SUTURE) IMPLANT
SUT MNCRL AB 3-0 PS2 18 (SUTURE) ×3 IMPLANT
SUT MNCRL AB 4-0 PS2 18 (SUTURE) ×3 IMPLANT
SUT VIC AB 3-0 SH 18 (SUTURE) ×3 IMPLANT
SYR CONTROL 10ML LL (SYRINGE) ×3 IMPLANT
TOWEL OR 17X26 10 PK STRL BLUE (TOWEL DISPOSABLE) ×3 IMPLANT

## 2015-09-09 NOTE — Transfer of Care (Signed)
Immediate Anesthesia Transfer of Care Note  Patient: Drew Wyatt  Procedure(s) Performed: Procedure(s): EXCISION SOFT TISSUE MASS LEFT FLANK  (Left) EXCISION MASS UPPER EXTREMETIES (Right)  Patient Location: PACU  Anesthesia Type:General  Level of Consciousness:  sedated, patient cooperative and responds to stimulation  Airway & Oxygen Therapy:Patient Spontanous Breathing and Patient connected to face mask oxgen  Post-op Assessment:  Report given to PACU RN and Post -op Vital signs reviewed and stable  Post vital signs:  Reviewed and stable  Last Vitals:  Filed Vitals:   09/09/15 0730  BP: 118/78  Pulse: 66  Temp: 36.5 C  Resp: 18    Complications: No apparent anesthesia complications

## 2015-09-09 NOTE — Anesthesia Postprocedure Evaluation (Signed)
Anesthesia Post Note  Patient: Drew Wyatt  Procedure(s) Performed: Procedure(s) (LRB): EXCISION SOFT TISSUE MASS LEFT FLANK  (Left) EXCISION MASS UPPER EXTREMETIES (Right)  Patient location during evaluation: PACU Anesthesia Type: General Level of consciousness: awake and alert Pain management: pain level controlled Vital Signs Assessment: post-procedure vital signs reviewed and stable Respiratory status: spontaneous breathing, nonlabored ventilation, respiratory function stable and patient connected to nasal cannula oxygen Cardiovascular status: blood pressure returned to baseline and stable Postop Assessment: no signs of nausea or vomiting Anesthetic complications: no    Last Vitals:  Filed Vitals:   09/09/15 1145 09/09/15 1200  BP: 127/84 131/70  Pulse: 71 66  Temp:  36.4 C  Resp: 16 14    Last Pain:  Filed Vitals:   09/09/15 1205  PainSc: 0-No pain                 [place] Kirchner L

## 2015-09-09 NOTE — Anesthesia Procedure Notes (Signed)
Procedure Name: Intubation Date/Time: 09/09/2015 9:28 AM Performed by: Anne Fu Pre-anesthesia Checklist: Patient identified, Emergency Drugs available, Suction available, Patient being monitored and Timeout performed Patient Re-evaluated:Patient Re-evaluated prior to inductionOxygen Delivery Method: Circle system utilized Preoxygenation: Pre-oxygenation with 100% oxygen Intubation Type: IV induction Ventilation: Mask ventilation without difficulty Laryngoscope Size: Mac and 4 Grade View: Grade I Tube type: Oral Tube size: 7.5 mm Number of attempts: 1 Airway Equipment and Method: Stylet Placement Confirmation: ETT inserted through vocal cords under direct vision,  positive ETCO2,  CO2 detector and breath sounds checked- equal and bilateral Secured at: 23 cm Tube secured with: Tape Dental Injury: Teeth and Oropharynx as per pre-operative assessment

## 2015-09-09 NOTE — Interval H&P Note (Signed)
History and Physical Interval Note:  09/09/2015 9:08 AM  Drew Wyatt  has presented today for surgery, with the diagnosis of soft tissue mass left flank intra-muscular soft tissue mass right forearm. The various methods of treatment have been discussed with the patient and family. After consideration of risks, benefits and other options for treatment, the patient has consented to    Procedure(s): EXCISION SOFT TISSUE MASS LEFT FLANK  (Left) EXCISION MASS UPPER EXTREMETIES (Right) as a surgical intervention .    The patient's history has been reviewed, patient examined, no change in status, stable for surgery.  I have reviewed the patient's chart and labs.  Questions were answered to the patient's satisfaction.    Earnstine Regal, MD, [facility] Office: [place]

## 2015-09-09 NOTE — Anesthesia Preprocedure Evaluation (Signed)
Anesthesia Evaluation  Patient identified by MRN, date of birth, ID band Patient awake    Reviewed: Allergy & Precautions, H&P , NPO status , Patient's Chart, lab work & pertinent test results  Airway Mallampati: II  TM Distance: >3 FB Neck ROM: full    Dental no notable dental hx. (+) Dental Advisory Given   Pulmonary neg pulmonary ROS,    Pulmonary exam normal breath sounds clear to auscultation       Cardiovascular Exercise Tolerance: Good negative cardio ROS Normal cardiovascular exam Rhythm:regular Rate:Normal     Neuro/Psych negative neurological ROS  negative psych ROS   GI/Hepatic negative GI ROS, Neg liver ROS,   Endo/Other  negative endocrine ROS  Renal/GU negative Renal ROS  negative genitourinary   Musculoskeletal   Abdominal   Peds  Hematology negative hematology ROS (+)   Anesthesia Other Findings   Reproductive/Obstetrics negative OB ROS                             Anesthesia Physical Anesthesia Plan  ASA: II  Anesthesia Plan: General   Post-op Pain Management:    Induction: Intravenous  Airway Management Planned: LMA  Additional Equipment:   Intra-op Plan:   Post-operative Plan:   Informed Consent: I have reviewed the patients History and Physical, chart, labs and discussed the procedure including the risks, benefits and alternatives for the proposed anesthesia with the patient or authorized representative who has indicated his/her understanding and acceptance.   Dental Advisory Given  Plan Discussed with: CRNA and Surgeon  Anesthesia Plan Comments:         Anesthesia Quick Evaluation

## 2015-09-09 NOTE — Brief Op Note (Signed)
09/09/2015  10:58 AM  PATIENT:  Drew Wyatt  69 y.o. male  PRE-OPERATIVE DIAGNOSIS:  soft tissue mass left flank intra-muscular soft tissue mass right forearm  POST-OPERATIVE DIAGNOSIS:  soft tissue mass left flank intra-muscular soft tissue mass right forearm  PROCEDURE:  Procedure(s): EXCISION SOFT TISSUE MASS LEFT FLANK  (Left) EXCISION MASS UPPER EXTREMETIES (Right)  SURGEON:  Surgeon(s) and Role:    * Armandina Gemma, MD - Primary  ANESTHESIA:   general  EBL:     BLOOD ADMINISTERED:none  DRAINS: none   LOCAL MEDICATIONS USED:  MARCAINE     SPECIMEN:  Excision  DISPOSITION OF SPECIMEN:  PATHOLOGY  COUNTS:  YES  TOURNIQUET:  * No tourniquets in log *  DICTATION: .Other Dictation: Dictation Number [id number]  PLAN OF CARE: Discharge to home after PACU  PATIENT DISPOSITION:  PACU - hemodynamically stable.   Delay start of Pharmacological VTE agent (>24hrs) due to surgical blood loss or risk of bleeding: yes  Earnstine Regal, MD, [facility] Office: [phone]

## 2015-09-09 NOTE — Discharge Instructions (Signed)
General Anesthesia, Adult, Care After Refer to this sheet in the next few weeks. These instructions provide you with information on caring for yourself after your procedure. Your health care provider may also give you more specific instructions. Your treatment has been planned according to current medical practices, but problems sometimes occur. Call your health care provider if you have any problems or questions after your procedure. WHAT TO EXPECT AFTER THE PROCEDURE After the procedure, it is typical to experience:  Sleepiness.  Nausea and vomiting. HOME CARE INSTRUCTIONS  For the first 24 hours after general anesthesia:  Have a responsible person with you.  Do not drive a car. If you are alone, do not take public transportation.  Do not drink alcohol.  Do not take medicine that has not been prescribed by your health care provider.  Do not sign important papers or make important decisions.  You may resume a normal diet and activities as directed by your health care provider.  If you have questions or problems that seem related to general anesthesia, call the hospital and ask for the anesthetist or anesthesiologist on call. SEEK MEDICAL CARE IF:  You have nausea and vomiting that continue the day after anesthesia.  You develop a rash. SEEK IMMEDIATE MEDICAL CARE IF:   You have difficulty breathing.  You have chest pain.  You have any allergic problems.   This information is not intended to replace advice given to you by your health care provider. Make sure you discuss any questions you have with your health care provider.   Document Released: 12/20/2000 Document Revised: 10/04/2014 Document Reviewed: 01/12/2012 Elsevier Interactive Patient Education [employer].

## 2015-09-10 NOTE — Op Note (Signed)
NAMEMACLAREN, ARROYAVE NO.:  [id number]  MEDICAL RECORD NO.:  [id number]  LOCATION:  [facility]                         FACILITY:  [facility]  PHYSICIAN:  Earnstine Regal, MD      DATE OF BIRTH:  1946-09-17  DATE OF PROCEDURE:  09/09/2015                              OPERATIVE REPORT   PREOPERATIVE DIAGNOSES: 1. Soft tissue mass, left flank. 2. Soft tissue mass, right forearm.  POSTOPERATIVE DIAGNOSES: 1. Soft tissue mass, left flank. 2. Soft tissue mass, right forearm.  PROCEDURE: 1. Excision of soft tissue mass, left flank, intramuscular (12 x 12 x     4 cm). 2. Excision of soft tissue mass, ventral surface, left forearm,     subcutaneous (1.5 x 1 x 0.5 cm).  SURGEON:  Earnstine Regal, MD, FACS  ANESTHESIA:  General.  ESTIMATED BLOOD LOSS:  Minimal.  PREPARATION:  ChloraPrep.  COMPLICATIONS:  None.  INDICATIONS:  The patient is a 69 year old male, referred by his primary care physician, Dr. Cari Caraway, for excision of soft tissue masses. The patient had been evaluated nearly 8 years ago.  He underwent an MRI scan in January 2009, showing a septated soft tissue mass which was intramuscular in the left latissimus dorsi muscle.  At that time, it measured 8.8 cm.  The patient has noted gradual increase in size over the past 8 years.  He has not noted any significant discomfort. However, the mass in the subcutaneous tissues of the right forearm is uncomfortable and causes intermittent pain.  He now comes to Surgery for excision of both lesions.  BODY OF REPORT:  Procedure was done in OR #2 at the [facility].  The patient was brought to the operating room, placed in supine position on the operating room table.  Following administration of general anesthesia, the patient was turned to a right lateral decubitus position on the bean bag and positioned appropriately. After ascertaining that an adequate level of anesthesia had been achieved, the  patient was prepped and draped in the usual aseptic fashion.  An 8 cm incision was made over the mass on the left flank. Dissection was carried through subcutaneous tissues and hemostasis achieved with the electrocautery.  Latissimus dorsi muscle was incised longitudinally along the line of its fibers.  Posterior to the latissimus dorsi muscle was a lipomatous mass, which appeared to be benign yellow adipose tissue.  It was gently dissected out with blunt dissection and used the electrocautery.  Vascular tributaries were divided with the electrocautery.  The entire mass was mobilized and excised.  It measured 12 x 12 x 4 cm in size and was submitted in its entirety to Pathology for review.  The cavity was irrigated with warm saline and good hemostasis was noted throughout.  A layered closure was performed.  3-0 Vicryl sutures were used to reapproximate the latissimus dorsi muscle.  3-0 Vicryl sutures were used to approximate the overlying muscle fascia.  3-0 Vicryl interrupted sutures were used to close the subcutaneous tissues.  Skin was anesthetized with local anesthetic.  Skin edges were reapproximated with a running 3-0 Monocryl subcuticular suture.  Wound was washed and dried and benzoin  and Steri-Strips were applied.  Sterile dressings were secured with Tegaderm.  The patient was turned from a right lateral decubitus position back to a supine position.  The right arm was then positioned on an arm board. The lesion on the ventral surface of the right forearm was then prepped and draped in the usual aseptic fashion.  After ascertaining that an adequate level of anesthesia had been maintained, a 2 cm incision was made over the mass with a #15 blade.  Dissection was carried in subcutaneous tissues with the electrocautery.  The mass was localized and appeared to be bilobed.  It was excised in its entirety and submitted to Pathology for review.  Local block was placed with Marcaine.   Skin edges were reapproximated with a running 4-0 Monocryl subcuticular suture.  Wound was washed and dried and benzoin and Steri- Strips were applied.  Sterile dressings were applied.  The patient was awakened from anesthesia and brought to the recovery room.  The patient tolerated the procedure well.   Earnstine Regal, MD, [facility] Office: [phone]    TMG/MEDQ  D:  09/09/2015  T:  09/10/2015  Job:  [id number]  cc:   Leitha Bleak, M.D. Fax: [phone]  [address], [place], [place] [address]

## 2015-09-10 NOTE — Progress Notes (Signed)
Quick Note:  Please contact patient and notify of benign pathology results.  Drew Wyatt M. Izell Labat, MD, FACS [facility] Office: [phone]   ______ 

## 2015-12-23 ENCOUNTER — Other Ambulatory Visit: Payer: Self-pay | Admitting: Gastroenterology

## 2015-12-23 DIAGNOSIS — D126 Benign neoplasm of colon, unspecified: Secondary | ICD-10-CM | POA: Diagnosis not present

## 2015-12-23 DIAGNOSIS — Z86010 Personal history of colonic polyps: Secondary | ICD-10-CM | POA: Diagnosis not present

## 2015-12-23 DIAGNOSIS — D127 Benign neoplasm of rectosigmoid junction: Secondary | ICD-10-CM | POA: Diagnosis not present

## 2015-12-23 DIAGNOSIS — K621 Rectal polyp: Secondary | ICD-10-CM | POA: Diagnosis not present

## 2016-01-20 DIAGNOSIS — D2372 Other benign neoplasm of skin of left lower limb, including hip: Secondary | ICD-10-CM | POA: Diagnosis not present

## 2016-01-20 DIAGNOSIS — D224 Melanocytic nevi of scalp and neck: Secondary | ICD-10-CM | POA: Diagnosis not present

## 2016-01-20 DIAGNOSIS — L218 Other seborrheic dermatitis: Secondary | ICD-10-CM | POA: Diagnosis not present

## 2016-01-20 DIAGNOSIS — L821 Other seborrheic keratosis: Secondary | ICD-10-CM | POA: Diagnosis not present

## 2016-01-20 DIAGNOSIS — D225 Melanocytic nevi of trunk: Secondary | ICD-10-CM | POA: Diagnosis not present

## 2016-01-20 DIAGNOSIS — D1801 Hemangioma of skin and subcutaneous tissue: Secondary | ICD-10-CM | POA: Diagnosis not present

## 2016-03-17 DIAGNOSIS — R1013 Epigastric pain: Secondary | ICD-10-CM | POA: Diagnosis not present

## 2016-03-17 DIAGNOSIS — R140 Abdominal distension (gaseous): Secondary | ICD-10-CM | POA: Diagnosis not present

## 2016-04-13 DIAGNOSIS — R1013 Epigastric pain: Secondary | ICD-10-CM | POA: Diagnosis not present

## 2016-04-15 ENCOUNTER — Other Ambulatory Visit: Payer: Self-pay | Admitting: Gastroenterology

## 2016-04-15 DIAGNOSIS — K449 Diaphragmatic hernia without obstruction or gangrene: Secondary | ICD-10-CM | POA: Diagnosis not present

## 2016-04-15 DIAGNOSIS — K2970 Gastritis, unspecified, without bleeding: Secondary | ICD-10-CM | POA: Diagnosis not present

## 2016-04-15 DIAGNOSIS — R1013 Epigastric pain: Secondary | ICD-10-CM | POA: Diagnosis not present

## 2016-06-28 DIAGNOSIS — N529 Male erectile dysfunction, unspecified: Secondary | ICD-10-CM | POA: Diagnosis not present

## 2016-06-28 DIAGNOSIS — Z0000 Encounter for general adult medical examination without abnormal findings: Secondary | ICD-10-CM | POA: Diagnosis not present

## 2016-06-28 DIAGNOSIS — R1013 Epigastric pain: Secondary | ICD-10-CM | POA: Diagnosis not present

## 2016-06-28 DIAGNOSIS — Z125 Encounter for screening for malignant neoplasm of prostate: Secondary | ICD-10-CM | POA: Diagnosis not present

## 2016-06-28 DIAGNOSIS — Z1159 Encounter for screening for other viral diseases: Secondary | ICD-10-CM | POA: Diagnosis not present

## 2016-06-28 DIAGNOSIS — R7301 Impaired fasting glucose: Secondary | ICD-10-CM | POA: Diagnosis not present

## 2016-06-28 DIAGNOSIS — E782 Mixed hyperlipidemia: Secondary | ICD-10-CM | POA: Diagnosis not present

## 2016-06-28 DIAGNOSIS — Z86010 Personal history of colonic polyps: Secondary | ICD-10-CM | POA: Diagnosis not present

## 2016-06-28 DIAGNOSIS — M1000 Idiopathic gout, unspecified site: Secondary | ICD-10-CM | POA: Diagnosis not present

## 2016-06-28 DIAGNOSIS — F419 Anxiety disorder, unspecified: Secondary | ICD-10-CM | POA: Diagnosis not present

## 2016-06-28 DIAGNOSIS — Z7189 Other specified counseling: Secondary | ICD-10-CM | POA: Diagnosis not present

## 2016-06-28 DIAGNOSIS — Z1389 Encounter for screening for other disorder: Secondary | ICD-10-CM | POA: Diagnosis not present

## 2016-06-28 DIAGNOSIS — Z23 Encounter for immunization: Secondary | ICD-10-CM | POA: Diagnosis not present

## 2016-07-02 DIAGNOSIS — F419 Anxiety disorder, unspecified: Secondary | ICD-10-CM | POA: Diagnosis not present

## 2016-07-02 DIAGNOSIS — E782 Mixed hyperlipidemia: Secondary | ICD-10-CM | POA: Diagnosis not present

## 2016-07-02 DIAGNOSIS — Z125 Encounter for screening for malignant neoplasm of prostate: Secondary | ICD-10-CM | POA: Diagnosis not present

## 2016-07-02 DIAGNOSIS — N529 Male erectile dysfunction, unspecified: Secondary | ICD-10-CM | POA: Diagnosis not present

## 2016-07-02 DIAGNOSIS — Z23 Encounter for immunization: Secondary | ICD-10-CM | POA: Diagnosis not present

## 2016-07-02 DIAGNOSIS — Z0000 Encounter for general adult medical examination without abnormal findings: Secondary | ICD-10-CM | POA: Diagnosis not present

## 2016-07-02 DIAGNOSIS — R7301 Impaired fasting glucose: Secondary | ICD-10-CM | POA: Diagnosis not present

## 2016-07-02 DIAGNOSIS — R1013 Epigastric pain: Secondary | ICD-10-CM | POA: Diagnosis not present

## 2016-07-02 DIAGNOSIS — M1000 Idiopathic gout, unspecified site: Secondary | ICD-10-CM | POA: Diagnosis not present

## 2016-07-02 DIAGNOSIS — Z86010 Personal history of colonic polyps: Secondary | ICD-10-CM | POA: Diagnosis not present

## 2016-07-02 DIAGNOSIS — Z7189 Other specified counseling: Secondary | ICD-10-CM | POA: Diagnosis not present

## 2016-07-12 DIAGNOSIS — Z961 Presence of intraocular lens: Secondary | ICD-10-CM | POA: Diagnosis not present

## 2016-10-12 DIAGNOSIS — Z7189 Other specified counseling: Secondary | ICD-10-CM | POA: Diagnosis not present

## 2016-10-12 DIAGNOSIS — F419 Anxiety disorder, unspecified: Secondary | ICD-10-CM | POA: Diagnosis not present

## 2016-10-12 DIAGNOSIS — M545 Low back pain: Secondary | ICD-10-CM | POA: Diagnosis not present

## 2016-10-15 ENCOUNTER — Encounter: Payer: No Typology Code available for payment source | Admitting: Internal Medicine

## 2016-10-29 DIAGNOSIS — F419 Anxiety disorder, unspecified: Secondary | ICD-10-CM | POA: Diagnosis not present

## 2016-10-29 DIAGNOSIS — S161XXA Strain of muscle, fascia and tendon at neck level, initial encounter: Secondary | ICD-10-CM | POA: Diagnosis not present

## 2016-11-19 DIAGNOSIS — F419 Anxiety disorder, unspecified: Secondary | ICD-10-CM | POA: Diagnosis not present

## 2016-11-19 DIAGNOSIS — S161XXD Strain of muscle, fascia and tendon at neck level, subsequent encounter: Secondary | ICD-10-CM | POA: Diagnosis not present

## 2016-11-19 DIAGNOSIS — F418 Other specified anxiety disorders: Secondary | ICD-10-CM | POA: Diagnosis not present

## 2016-11-30 DIAGNOSIS — D1039 Benign neoplasm of other parts of mouth: Secondary | ICD-10-CM | POA: Diagnosis not present

## 2016-12-01 DIAGNOSIS — D105 Benign neoplasm of other parts of oropharynx: Secondary | ICD-10-CM | POA: Diagnosis not present

## 2016-12-01 DIAGNOSIS — K091 Developmental (nonodontogenic) cysts of oral region: Secondary | ICD-10-CM | POA: Diagnosis not present

## 2016-12-06 DIAGNOSIS — K098 Other cysts of oral region, not elsewhere classified: Secondary | ICD-10-CM | POA: Diagnosis not present

## 2016-12-06 DIAGNOSIS — K091 Developmental (nonodontogenic) cysts of oral region: Secondary | ICD-10-CM | POA: Diagnosis not present

## 2016-12-28 DIAGNOSIS — M47812 Spondylosis without myelopathy or radiculopathy, cervical region: Secondary | ICD-10-CM | POA: Diagnosis not present

## 2017-01-05 DIAGNOSIS — D225 Melanocytic nevi of trunk: Secondary | ICD-10-CM | POA: Diagnosis not present

## 2017-01-05 DIAGNOSIS — L821 Other seborrheic keratosis: Secondary | ICD-10-CM | POA: Diagnosis not present

## 2017-01-05 DIAGNOSIS — L570 Actinic keratosis: Secondary | ICD-10-CM | POA: Diagnosis not present

## 2017-01-05 DIAGNOSIS — D2271 Melanocytic nevi of right lower limb, including hip: Secondary | ICD-10-CM | POA: Diagnosis not present

## 2017-01-05 DIAGNOSIS — L718 Other rosacea: Secondary | ICD-10-CM | POA: Diagnosis not present

## 2017-01-05 DIAGNOSIS — D1801 Hemangioma of skin and subcutaneous tissue: Secondary | ICD-10-CM | POA: Diagnosis not present

## 2017-01-05 DIAGNOSIS — D2272 Melanocytic nevi of left lower limb, including hip: Secondary | ICD-10-CM | POA: Diagnosis not present

## 2017-01-31 ENCOUNTER — Emergency Department (HOSPITAL_COMMUNITY): Payer: Medicare Other

## 2017-01-31 ENCOUNTER — Emergency Department (HOSPITAL_COMMUNITY)
Admission: EM | Admit: 2017-01-31 | Discharge: 2017-01-31 | Disposition: A | Discharge status: Home / Self Care | Payer: Medicare Other | Attending: Emergency Medicine | Admitting: Emergency Medicine

## 2017-01-31 ENCOUNTER — Encounter (HOSPITAL_COMMUNITY): Payer: Self-pay

## 2017-01-31 DIAGNOSIS — Y92410 Unspecified street and highway as the place of occurrence of the external cause: Secondary | ICD-10-CM | POA: Diagnosis not present

## 2017-01-31 DIAGNOSIS — Z7982 Long term (current) use of aspirin: Secondary | ICD-10-CM | POA: Insufficient documentation

## 2017-01-31 DIAGNOSIS — M25562 Pain in left knee: Secondary | ICD-10-CM | POA: Diagnosis not present

## 2017-01-31 DIAGNOSIS — S42002A Fracture of unspecified part of left clavicle, initial encounter for closed fracture: Secondary | ICD-10-CM | POA: Diagnosis not present

## 2017-01-31 DIAGNOSIS — S76102A Unspecified injury of left quadriceps muscle, fascia and tendon, initial encounter: Secondary | ICD-10-CM | POA: Diagnosis not present

## 2017-01-31 DIAGNOSIS — S42032A Displaced fracture of lateral end of left clavicle, initial encounter for closed fracture: Secondary | ICD-10-CM | POA: Diagnosis not present

## 2017-01-31 DIAGNOSIS — Y9355 Activity, bike riding: Secondary | ICD-10-CM | POA: Insufficient documentation

## 2017-01-31 DIAGNOSIS — S76112A Strain of left quadriceps muscle, fascia and tendon, initial encounter: Secondary | ICD-10-CM | POA: Diagnosis not present

## 2017-01-31 DIAGNOSIS — Z79899 Other long term (current) drug therapy: Secondary | ICD-10-CM | POA: Insufficient documentation

## 2017-01-31 DIAGNOSIS — S4992XA Unspecified injury of left shoulder and upper arm, initial encounter: Secondary | ICD-10-CM | POA: Diagnosis present

## 2017-01-31 DIAGNOSIS — Y999 Unspecified external cause status: Secondary | ICD-10-CM | POA: Diagnosis not present

## 2017-01-31 DIAGNOSIS — W19XXXA Unspecified fall, initial encounter: Secondary | ICD-10-CM

## 2017-01-31 DIAGNOSIS — S8992XA Unspecified injury of left lower leg, initial encounter: Secondary | ICD-10-CM | POA: Diagnosis not present

## 2017-01-31 DIAGNOSIS — S76109A Unspecified injury of unspecified quadriceps muscle, fascia and tendon, initial encounter: Secondary | ICD-10-CM

## 2017-01-31 DIAGNOSIS — T148XXA Other injury of unspecified body region, initial encounter: Secondary | ICD-10-CM | POA: Diagnosis not present

## 2017-01-31 MED ORDER — LORAZEPAM 2 MG/ML IJ SOLN: 1.0000 mg | INTRAMUSCULAR | Status: DC | PRN

## 2017-01-31 MED ORDER — OXYCODONE HCL 5 MG PO TABS: 5.0000 mg | ORAL_TABLET | Freq: Four times a day (QID) | ORAL | 0 refills | Status: DC | PRN

## 2017-01-31 MED ORDER — OXYCODONE HCL 5 MG PO TABS
10.0000 mg | ORAL_TABLET | Freq: Once | ORAL | Status: AC
  Administered 2017-01-31: 10 mg via ORAL
  Filled 2017-01-31: qty 2

## 2017-01-31 MED ORDER — OXYCODONE-ACETAMINOPHEN 5-325 MG PO TABS
1.0000 | ORAL_TABLET | Freq: Once | ORAL | Status: AC
  Administered 2017-01-31: 1 via ORAL
  Filled 2017-01-31: qty 1

## 2017-01-31 NOTE — ED Notes (Signed)
Patient Alert and oriented X4. Stable and ambulatory. Patient verbalized understanding of the discharge instructions.  Patient belongings were taken by the patient.  
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
## 2017-01-31 NOTE — ED Notes (Signed)
Social Work at bedside 

## 2017-01-31 NOTE — Discharge Instructions (Addendum)
Keep left arm in sling at all times, no lifting, pushing, or pulling.  Use knee immobilized whenever you put weight on leg. Ok to have it off when not using leg.  Use ice on shoulder and knee for 20 minutes 4 times daily.  Take 600 mg ibuprofen and 650 mg of Tylenol every 8 hours for the next 3 days

## 2017-01-31 NOTE — Care Management (Signed)
ED CM spoke with Dr. Lanetta Inch and Aaron Edelman RN concerning patient possible benefiting from a platform walker. CM met with patient and wife at bedside to discuss recommendations. For equipment. Patient has left a shoulder fx and lt knee pain with immobilization but he can bear some weight on the left leg. After assessment Patient may not benefit from a platform walker due to shoulder injury with sling. Patient may benefit more from a w/c. Patient states, they have a w/c at home. He is agreeable to the discharge plan using a w/c and following up with ortho. Updated Dr. Lanetta Inch and Aaron Edelman RN. No further CM needs identified.

## 2017-01-31 NOTE — ED Provider Notes (Signed)
c/f quadriceps rupture.  Ortho to see. f/u mri.  MRI reveals quadricep rupture. Patient given knee immobilizer. Sling supply for clavicular fracture. We'll plan to follow up with Ortho as advised for further surgical management.  I have reviewed all imaging. Patient stable for discharge home.  I have reviewed all results with the patient. Patient agrees to stated plan. All questions answered. Advised to call or return to have any questions, new symptoms, change in symptoms, or symptoms that they do not understand.    Heriberto Antigua, MD 01/31/17 2204    Veryl Speak, MD 02/01/17 1535

## 2017-01-31 NOTE — ED Notes (Signed)
Patient ambulated to the restroom with RN at side.  Patient did well with walking.  Only pain was in shoulder.
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
## 2017-02-02 DIAGNOSIS — S76112A Strain of left quadriceps muscle, fascia and tendon, initial encounter: Secondary | ICD-10-CM | POA: Diagnosis not present

## 2017-02-02 DIAGNOSIS — S42032A Displaced fracture of lateral end of left clavicle, initial encounter for closed fracture: Secondary | ICD-10-CM | POA: Diagnosis not present
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
## 2017-02-03 ENCOUNTER — Encounter (HOSPITAL_COMMUNITY): Payer: Self-pay | Admitting: *Deleted

## 2017-02-04 ENCOUNTER — Ambulatory Visit (HOSPITAL_COMMUNITY): Payer: Medicare Other | Admitting: Certified Registered"

## 2017-02-04 ENCOUNTER — Encounter (HOSPITAL_COMMUNITY)
Admission: RE | Disposition: A | Discharge status: Home Health | Payer: Self-pay | Source: Ambulatory Visit | Attending: Orthopedic Surgery

## 2017-02-04 ENCOUNTER — Encounter (HOSPITAL_COMMUNITY): Payer: Self-pay | Admitting: *Deleted

## 2017-02-04 ENCOUNTER — Inpatient Hospital Stay (HOSPITAL_COMMUNITY)
Admission: RE | Admit: 2017-02-04 | Discharge: 2017-02-05 | DRG: 502 | Disposition: A | Discharge status: Home Health | Payer: Medicare Other | Source: Ambulatory Visit | Attending: Orthopedic Surgery | Admitting: Orthopedic Surgery

## 2017-02-04 ENCOUNTER — Inpatient Hospital Stay (HOSPITAL_COMMUNITY): Payer: Medicare Other

## 2017-02-04 DIAGNOSIS — G8918 Other acute postprocedural pain: Secondary | ICD-10-CM | POA: Diagnosis not present

## 2017-02-04 DIAGNOSIS — S42032A Displaced fracture of lateral end of left clavicle, initial encounter for closed fracture: Secondary | ICD-10-CM | POA: Diagnosis present

## 2017-02-04 DIAGNOSIS — S76112A Strain of left quadriceps muscle, fascia and tendon, initial encounter: Secondary | ICD-10-CM | POA: Diagnosis present

## 2017-02-04 DIAGNOSIS — Z79899 Other long term (current) drug therapy: Secondary | ICD-10-CM

## 2017-02-04 DIAGNOSIS — Z87891 Personal history of nicotine dependence: Secondary | ICD-10-CM | POA: Diagnosis not present

## 2017-02-04 DIAGNOSIS — K635 Polyp of colon: Secondary | ICD-10-CM | POA: Diagnosis not present

## 2017-02-04 HISTORY — PX: ORIF CLAVICULAR FRACTURE: SHX5055

## 2017-02-04 HISTORY — PX: QUADRICEPS TENDON REPAIR: SHX756

## 2017-02-04 HISTORY — DX: Anxiety disorder, unspecified: F41.9

## 2017-02-04 LAB — CBC
HCT: 38.8 % — ABNORMAL LOW (ref 39.0–52.0)
HCT: 38.1 % — ABNORMAL LOW (ref 39.0–52.0)
Hemoglobin: 13.2 g/dL (ref 13.0–17.0)
Hemoglobin: 12.4 g/dL — ABNORMAL LOW (ref 13.0–17.0)
MCH: 31.1 pg (ref 26.0–34.0)
MCH: 30 pg (ref 26.0–34.0)
MCHC: 34 g/dL (ref 30.0–36.0)
MCHC: 32.5 g/dL (ref 30.0–36.0)
MCV: 91.3 fL (ref 78.0–100.0)
MCV: 92.3 fL (ref 78.0–100.0)
PLATELETS: 157 10*3/uL (ref 150–400)
PLATELETS: 143 10*3/uL — AB (ref 150–400)
RBC: 4.25 MIL/uL (ref 4.22–5.81)
RBC: 4.13 MIL/uL — ABNORMAL LOW (ref 4.22–5.81)
RDW: 13.6 % (ref 11.5–15.5)
RDW: 13.4 % (ref 11.5–15.5)
WBC: 8.4 10*3/uL (ref 4.0–10.5)
WBC: 10.7 10*3/uL — AB (ref 4.0–10.5)

## 2017-02-04 LAB — CREATININE, SERUM
Creatinine, Ser: 0.93 mg/dL (ref 0.61–1.24)
GFR calc non Af Amer: >60 mL/min (ref >60)

## 2017-02-04 SURGERY — OPEN REDUCTION INTERNAL FIXATION (ORIF) CLAVICULAR FRACTURE
Anesthesia: General | Site: Shoulder | Laterality: Left

## 2017-02-04 MED ORDER — 0.9 % SODIUM CHLORIDE (POUR BTL) OPTIME
TOPICAL | Status: DC | PRN
  Administered 2017-02-04 (×2): 1000 mL

## 2017-02-04 MED ORDER — OXYCODONE HCL 5 MG PO TABS
ORAL_TABLET | ORAL | Status: AC
Start: 2017-02-04 — End: 2017-02-04
  Administered 2017-02-04: 10 mg via ORAL
  Filled 2017-02-04: qty 2

## 2017-02-04 MED ORDER — SUCCINYLCHOLINE 20MG/ML (10ML) SYRINGE FOR MEDFUSION PUMP - OPTIME
INTRAMUSCULAR | Status: DC | PRN
  Administered 2017-02-04: 100 mg via INTRAVENOUS

## 2017-02-04 MED ORDER — LIDOCAINE 2% (20 MG/ML) 5 ML SYRINGE
INTRAMUSCULAR | Status: AC
  Filled 2017-02-04: qty 5

## 2017-02-04 MED ORDER — EPINEPHRINE PF 1 MG/ML IJ SOLN
INTRAMUSCULAR | Status: AC
  Filled 2017-02-04: qty 1

## 2017-02-04 MED ORDER — ROCURONIUM BROMIDE 100 MG/10ML IV SOLN
INTRAVENOUS | Status: DC | PRN
  Administered 2017-02-04: 20 mg via INTRAVENOUS
  Administered 2017-02-04 (×2): 10 mg via INTRAVENOUS
  Administered 2017-02-04: 50 mg via INTRAVENOUS

## 2017-02-04 MED ORDER — HYDROMORPHONE HCL 1 MG/ML IJ SOLN
0.2500 mg | INTRAMUSCULAR | Status: DC | PRN
  Administered 2017-02-04 (×2): 0.5 mg via INTRAVENOUS
  Administered 2017-02-04: 1 mg via INTRAVENOUS

## 2017-02-04 MED ORDER — OXYCODONE HCL 5 MG PO TABS
5.0000 mg | ORAL_TABLET | ORAL | Status: DC | PRN
  Administered 2017-02-04 – 2017-02-05 (×2): 10 mg via ORAL
  Filled 2017-02-04: qty 2

## 2017-02-04 MED ORDER — SUGAMMADEX SODIUM 200 MG/2ML IV SOLN
INTRAVENOUS | Status: AC
  Filled 2017-02-04: qty 2

## 2017-02-04 MED ORDER — EPHEDRINE SULFATE 50 MG/ML IJ SOLN
INTRAMUSCULAR | Status: DC | PRN
  Administered 2017-02-04: 5 mg via INTRAVENOUS

## 2017-02-04 MED ORDER — FENTANYL CITRATE (PF) 100 MCG/2ML IJ SOLN
INTRAMUSCULAR | Status: DC | PRN
  Administered 2017-02-04 (×3): 50 ug via INTRAVENOUS
  Administered 2017-02-04: 150 ug via INTRAVENOUS

## 2017-02-04 MED ORDER — DOCUSATE SODIUM 100 MG PO CAPS
100.0000 mg | ORAL_CAPSULE | Freq: Two times a day (BID) | ORAL | Status: DC
  Administered 2017-02-04: 100 mg via ORAL
  Filled 2017-02-04: qty 1

## 2017-02-04 MED ORDER — LACTATED RINGERS IV SOLN
INTRAVENOUS | Status: DC
  Administered 2017-02-04 (×4): via INTRAVENOUS

## 2017-02-04 MED ORDER — FENTANYL CITRATE (PF) 250 MCG/5ML IJ SOLN
INTRAMUSCULAR | Status: AC
  Filled 2017-02-04: qty 5

## 2017-02-04 MED ORDER — GLYCOPYRROLATE 0.2 MG/ML IJ SOLN
INTRAMUSCULAR | Status: DC | PRN
  Administered 2017-02-04: 0.2 mg via INTRAVENOUS

## 2017-02-04 MED ORDER — OXYCODONE-ACETAMINOPHEN 5-325 MG PO TABS: 1.0000 | ORAL_TABLET | ORAL | 0 refills | Status: DC | PRN

## 2017-02-04 MED ORDER — METOCLOPRAMIDE HCL 5 MG PO TABS: 5.0000 mg | ORAL_TABLET | Freq: Three times a day (TID) | ORAL | Status: DC | PRN

## 2017-02-04 MED ORDER — ONDANSETRON HCL 4 MG/2ML IJ SOLN
INTRAMUSCULAR | Status: DC | PRN
Start: 2017-02-04 — End: 2017-02-04
  Administered 2017-02-04: 4 mg via INTRAVENOUS

## 2017-02-04 MED ORDER — PROPOFOL 10 MG/ML IV BOLUS
INTRAVENOUS | Status: AC
  Filled 2017-02-04: qty 20

## 2017-02-04 MED ORDER — SUCCINYLCHOLINE CHLORIDE 200 MG/10ML IV SOSY
PREFILLED_SYRINGE | INTRAVENOUS | Status: AC
  Filled 2017-02-04: qty 10

## 2017-02-04 MED ORDER — KETOROLAC TROMETHAMINE 30 MG/ML IJ SOLN
INTRAMUSCULAR | Status: DC | PRN
  Administered 2017-02-04: 15 mg via INTRAVENOUS

## 2017-02-04 MED ORDER — PHENOL 1.4 % MT LIQD: 1.0000 | OROMUCOSAL | Status: DC | PRN

## 2017-02-04 MED ORDER — PROPOFOL 10 MG/ML IV BOLUS
INTRAVENOUS | Status: DC | PRN
  Administered 2017-02-04: 200 mg via INTRAVENOUS

## 2017-02-04 MED ORDER — CHLORHEXIDINE GLUCONATE 4 % EX LIQD: 60.0000 mL | Freq: Once | CUTANEOUS | Status: DC

## 2017-02-04 MED ORDER — PROMETHAZINE HCL 25 MG/ML IJ SOLN: 6.2500 mg | INTRAMUSCULAR | Status: DC | PRN

## 2017-02-04 MED ORDER — VITAMIN D3 25 MCG (1000 UNIT) PO TABS
1000.0000 [IU] | ORAL_TABLET | Freq: Every day | ORAL | Status: DC
  Administered 2017-02-05: 1000 [IU] via ORAL
  Filled 2017-02-04 (×2): qty 1

## 2017-02-04 MED ORDER — CEFAZOLIN SODIUM-DEXTROSE 2-4 GM/100ML-% IV SOLN
2.0000 g | Freq: Four times a day (QID) | INTRAVENOUS | Status: AC
  Administered 2017-02-04 – 2017-02-05 (×3): 2 g via INTRAVENOUS
  Filled 2017-02-04 (×3): qty 100

## 2017-02-04 MED ORDER — ONDANSETRON HCL 4 MG PO TABS: 4.0000 mg | ORAL_TABLET | Freq: Four times a day (QID) | ORAL | Status: DC | PRN

## 2017-02-04 MED ORDER — ACETAMINOPHEN-CODEINE #3 300-30 MG PO TABS
1.0000 | ORAL_TABLET | Freq: Four times a day (QID) | ORAL | Status: DC | PRN
  Administered 2017-02-05 (×2): 1 via ORAL
  Filled 2017-02-04 (×2): qty 1

## 2017-02-04 MED ORDER — HYDROMORPHONE HCL 1 MG/ML IJ SOLN
INTRAMUSCULAR | Status: AC
  Administered 2017-02-04: 0.5 mg via INTRAVENOUS
  Filled 2017-02-04: qty 1

## 2017-02-04 MED ORDER — HYDROMORPHONE HCL 1 MG/ML IJ SOLN: 0.5000 mg | INTRAMUSCULAR | Status: DC | PRN

## 2017-02-04 MED ORDER — ATORVASTATIN CALCIUM 40 MG PO TABS
40.0000 mg | ORAL_TABLET | Freq: Every evening | ORAL | Status: DC
  Administered 2017-02-04: 40 mg via ORAL
  Filled 2017-02-04: qty 1

## 2017-02-04 MED ORDER — ROCURONIUM BROMIDE 10 MG/ML (PF) SYRINGE
PREFILLED_SYRINGE | INTRAVENOUS | Status: AC
  Filled 2017-02-04: qty 5

## 2017-02-04 MED ORDER — BUPIVACAINE HCL (PF) 0.25 % IJ SOLN
INTRAMUSCULAR | Status: AC
  Filled 2017-02-04: qty 30

## 2017-02-04 MED ORDER — KETOROLAC TROMETHAMINE 30 MG/ML IJ SOLN: 30.0000 mg | Freq: Once | INTRAMUSCULAR | Status: DC | PRN

## 2017-02-04 MED ORDER — METHOCARBAMOL 500 MG PO TABS
ORAL_TABLET | ORAL | Status: AC
  Filled 2017-02-04: qty 1

## 2017-02-04 MED ORDER — ENOXAPARIN SODIUM 40 MG/0.4ML ~~LOC~~ SOLN
40.0000 mg | SUBCUTANEOUS | Status: DC
  Administered 2017-02-05: 40 mg via SUBCUTANEOUS
  Filled 2017-02-04: qty 0.4

## 2017-02-04 MED ORDER — ASPIRIN EC 81 MG PO TBEC: 81.0000 mg | DELAYED_RELEASE_TABLET | ORAL | Status: DC

## 2017-02-04 MED ORDER — CEFAZOLIN SODIUM-DEXTROSE 2-4 GM/100ML-% IV SOLN
INTRAVENOUS | Status: AC
  Filled 2017-02-04: qty 100

## 2017-02-04 MED ORDER — METOCLOPRAMIDE HCL 5 MG/ML IJ SOLN: 5.0000 mg | Freq: Three times a day (TID) | INTRAMUSCULAR | Status: DC | PRN

## 2017-02-04 MED ORDER — HYDROMORPHONE HCL 1 MG/ML IJ SOLN
INTRAMUSCULAR | Status: AC
Start: 2017-02-04 — End: 2017-02-04
  Administered 2017-02-04: 0.5 mg via INTRAVENOUS
  Filled 2017-02-04: qty 1

## 2017-02-04 MED ORDER — PHENYLEPHRINE HCL 10 MG/ML IJ SOLN
INTRAVENOUS | Status: DC | PRN
  Administered 2017-02-04: 25 ug/min via INTRAVENOUS

## 2017-02-04 MED ORDER — SUGAMMADEX SODIUM 200 MG/2ML IV SOLN
INTRAVENOUS | Status: DC | PRN
  Administered 2017-02-04: 200 mg via INTRAVENOUS

## 2017-02-04 MED ORDER — MEPERIDINE HCL 25 MG/ML IJ SOLN: 6.2500 mg | INTRAMUSCULAR | Status: DC | PRN

## 2017-02-04 MED ORDER — SODIUM CHLORIDE 0.9 % IV SOLN: INTRAVENOUS | Status: DC

## 2017-02-04 MED ORDER — ACETAMINOPHEN 325 MG PO TABS: 650.0000 mg | ORAL_TABLET | Freq: Four times a day (QID) | ORAL | Status: DC | PRN

## 2017-02-04 MED ORDER — CEFAZOLIN SODIUM-DEXTROSE 2-4 GM/100ML-% IV SOLN
2.0000 g | INTRAVENOUS | Status: AC
  Administered 2017-02-04: 2 g via INTRAVENOUS

## 2017-02-04 MED ORDER — POLYETHYLENE GLYCOL 3350 17 G PO PACK: 17.0000 g | PACK | Freq: Every day | ORAL | Status: DC | PRN

## 2017-02-04 MED ORDER — BUPIVACAINE-EPINEPHRINE 0.25% -1:200000 IJ SOLN
INTRAMUSCULAR | Status: DC | PRN
  Administered 2017-02-04: 10 mL
  Administered 2017-02-04: 9 mL

## 2017-02-04 MED ORDER — METHOCARBAMOL 500 MG PO TABS: 500.0000 mg | ORAL_TABLET | Freq: Three times a day (TID) | ORAL | 1 refills | Status: DC | PRN

## 2017-02-04 MED ORDER — MELATONIN 3 MG PO TABS
3.0000 mg | ORAL_TABLET | Freq: Every day | ORAL | Status: DC
  Administered 2017-02-04: 3 mg via ORAL
  Filled 2017-02-04: qty 1

## 2017-02-04 MED ORDER — COLCHICINE 0.6 MG PO TABS: 0.6000 mg | ORAL_TABLET | Freq: Every day | ORAL | Status: DC

## 2017-02-04 MED ORDER — ENOXAPARIN SODIUM 40 MG/0.4ML ~~LOC~~ SOLN: 40.0000 mg | SUBCUTANEOUS | 0 refills | Status: DC

## 2017-02-04 MED ORDER — ACETAMINOPHEN 650 MG RE SUPP: 650.0000 mg | Freq: Four times a day (QID) | RECTAL | Status: DC | PRN

## 2017-02-04 MED ORDER — ONDANSETRON HCL 4 MG/2ML IJ SOLN
INTRAMUSCULAR | Status: AC
  Filled 2017-02-04: qty 2

## 2017-02-04 MED ORDER — LIDOCAINE HCL (CARDIAC) 20 MG/ML IV SOLN
INTRAVENOUS | Status: DC | PRN
  Administered 2017-02-04: 100 mg via INTRAVENOUS

## 2017-02-04 MED ORDER — HYDROCODONE-ACETAMINOPHEN 5-325 MG PO TABS
1.0000 | ORAL_TABLET | ORAL | Status: DC | PRN
  Administered 2017-02-04 – 2017-02-05 (×2): 2 via ORAL
  Filled 2017-02-04 (×2): qty 2

## 2017-02-04 MED ORDER — KETOROLAC TROMETHAMINE 30 MG/ML IJ SOLN
INTRAMUSCULAR | Status: AC
  Filled 2017-02-04: qty 1

## 2017-02-04 MED ORDER — MENTHOL 3 MG MT LOZG: 1.0000 | LOZENGE | OROMUCOSAL | Status: DC | PRN

## 2017-02-04 MED ORDER — DEXTROSE 5 % IV SOLN
500.0000 mg | Freq: Four times a day (QID) | INTRAVENOUS | Status: DC | PRN
  Filled 2017-02-04: qty 5

## 2017-02-04 MED ORDER — SERTRALINE HCL 100 MG PO TABS
100.0000 mg | ORAL_TABLET | Freq: Every day | ORAL | Status: DC
  Administered 2017-02-04: 100 mg via ORAL
  Filled 2017-02-04: qty 1
  Filled 2017-02-04: qty 2

## 2017-02-04 MED ORDER — PHENYLEPHRINE 40 MCG/ML (10ML) SYRINGE FOR IV PUSH (FOR BLOOD PRESSURE SUPPORT)
PREFILLED_SYRINGE | INTRAVENOUS | Status: AC
  Filled 2017-02-04: qty 10

## 2017-02-04 MED ORDER — ALLOPURINOL 300 MG PO TABS
300.0000 mg | ORAL_TABLET | Freq: Every day | ORAL | Status: DC
  Filled 2017-02-04: qty 1

## 2017-02-04 MED ORDER — METHOCARBAMOL 500 MG PO TABS
500.0000 mg | ORAL_TABLET | Freq: Four times a day (QID) | ORAL | Status: DC | PRN
  Administered 2017-02-04 – 2017-02-05 (×3): 500 mg via ORAL
  Filled 2017-02-04 (×2): qty 1

## 2017-02-04 MED ORDER — ONDANSETRON HCL 4 MG/2ML IJ SOLN: 4.0000 mg | Freq: Four times a day (QID) | INTRAMUSCULAR | Status: DC | PRN

## 2017-02-04 SURGICAL SUPPLY — 100 items
BANDAGE ACE 4X5 VEL STRL LF (GAUZE/BANDAGES/DRESSINGS) IMPLANT
BANDAGE ACE 6X5 VEL STRL LF (GAUZE/BANDAGES/DRESSINGS) IMPLANT
BANDAGE ESMARK 6X9 LF (GAUZE/BANDAGES/DRESSINGS) ×2 IMPLANT
BIT DRILL Q COUPLING 4.5 (BIT) IMPLANT
BIT DRILL Q/COUPLING 1 (BIT) IMPLANT
BLADE CLIPPER SURG (BLADE) IMPLANT
BLADE SURG 10 STRL SS (BLADE) ×4 IMPLANT
BLADE SURG 11 STRL SS (BLADE) ×4 IMPLANT
BLADE SURG 15 STRL LF DISP TIS (BLADE) ×2 IMPLANT
BLADE SURG 15 STRL SS (BLADE) ×2
BNDG COHESIVE 6X5 TAN STRL LF (GAUZE/BANDAGES/DRESSINGS) ×4 IMPLANT
BNDG ELASTIC 6X10 VLCR STRL LF (GAUZE/BANDAGES/DRESSINGS) ×4 IMPLANT
BNDG ESMARK 6X9 LF (GAUZE/BANDAGES/DRESSINGS) ×4
BNDG GAUZE ELAST 4 BULKY (GAUZE/BANDAGES/DRESSINGS) ×4 IMPLANT
CAP PIN ORTHO PINK (CAP) IMPLANT
CLEANER TIP ELECTROSURG 2X2 (MISCELLANEOUS) ×4 IMPLANT
CLOSURE WOUND 1/2 X4 (GAUZE/BANDAGES/DRESSINGS) ×2
COVER MAYO STAND STRL (DRAPES) ×4 IMPLANT
COVER SURGICAL LIGHT HANDLE (MISCELLANEOUS) ×8 IMPLANT
CUFF TOURNIQUET SINGLE 34IN LL (TOURNIQUET CUFF) IMPLANT
CUFF TOURNIQUET SINGLE 44IN (TOURNIQUET CUFF) IMPLANT
DRAPE C-ARM 42X72 X-RAY (DRAPES) IMPLANT
DRAPE IMP U-DRAPE 54X76 (DRAPES) ×4 IMPLANT
DRAPE INCISE IOBAN 66X45 STRL (DRAPES) IMPLANT
DRAPE ORTHO SPLIT 77X108 STRL (DRAPES) ×4
DRAPE PROXIMA HALF (DRAPES) ×4 IMPLANT
DRAPE SURG ORHT 6 SPLT 77X108 (DRAPES) ×4 IMPLANT
DRAPE U-SHAPE 47X51 STRL (DRAPES) ×4 IMPLANT
DRSG ADAPTIC 3X8 NADH LF (GAUZE/BANDAGES/DRESSINGS) ×4 IMPLANT
DRSG EMULSION OIL 3X3 NADH (GAUZE/BANDAGES/DRESSINGS) IMPLANT
DRSG PAD ABDOMINAL 8X10 ST (GAUZE/BANDAGES/DRESSINGS) ×8 IMPLANT
DURAPREP 26ML APPLICATOR (WOUND CARE) ×8 IMPLANT
ELECT CAUTERY BLADE 6.4 (BLADE) ×4 IMPLANT
ELECT NEEDLE TIP 2.8 STRL (NEEDLE) ×4 IMPLANT
ELECT REM PT RETURN 9FT ADLT (ELECTROSURGICAL) ×4
ELECTRODE REM PT RTRN 9FT ADLT (ELECTROSURGICAL) ×2 IMPLANT
GAUZE SPONGE 4X4 12PLY STRL (GAUZE/BANDAGES/DRESSINGS) ×8 IMPLANT
GLOVE BIO SURGEON STRL SZ7.5 (GLOVE) ×4 IMPLANT
GLOVE BIOGEL PI ORTHO PRO 7.5 (GLOVE) ×2
GLOVE BIOGEL PI ORTHO PRO SZ8 (GLOVE) ×2
GLOVE ORTHO TXT STRL SZ7.5 (GLOVE) ×4 IMPLANT
GLOVE PI ORTHO PRO STRL 7.5 (GLOVE) ×2 IMPLANT
GLOVE PI ORTHO PRO STRL SZ8 (GLOVE) ×2 IMPLANT
GLOVE SURG ORTHO 8.5 STRL (GLOVE) ×4 IMPLANT
GOWN STRL REUS W/ TWL LRG LVL3 (GOWN DISPOSABLE) ×2 IMPLANT
GOWN STRL REUS W/ TWL XL LVL3 (GOWN DISPOSABLE) ×6 IMPLANT
GOWN STRL REUS W/TWL LRG LVL3 (GOWN DISPOSABLE) ×2
GOWN STRL REUS W/TWL XL LVL3 (GOWN DISPOSABLE) ×6
KIT BASIN OR (CUSTOM PROCEDURE TRAY) ×4 IMPLANT
KIT ROOM TURNOVER OR (KITS) ×4 IMPLANT
MANIFOLD NEPTUNE II (INSTRUMENTS) ×4 IMPLANT
NEEDLE 1/2 CIR MAYO (NEEDLE) ×4 IMPLANT
NEEDLE 22X1 1/2 (OR ONLY) (NEEDLE) IMPLANT
NEEDLE FILTER BLUNT 18X 1/2SAF (NEEDLE) ×2
NEEDLE FILTER BLUNT 18X1 1/2 (NEEDLE) ×2 IMPLANT
NEEDLE HYPO 25GX1X1/2 BEV (NEEDLE) ×4 IMPLANT
NEEDLE TAPERED W/ NITINOL LOOP (MISCELLANEOUS) IMPLANT
NS IRRIG 1000ML POUR BTL (IV SOLUTION) ×8 IMPLANT
PACK ORTHO EXTREMITY (CUSTOM PROCEDURE TRAY) ×4 IMPLANT
PACK SHOULDER (CUSTOM PROCEDURE TRAY) ×4 IMPLANT
PACK UNIVERSAL I (CUSTOM PROCEDURE TRAY) ×4 IMPLANT
PAD ARMBOARD 7.5X6 YLW CONV (MISCELLANEOUS) ×8 IMPLANT
PAD CAST 4YDX4 CTTN HI CHSV (CAST SUPPLIES) IMPLANT
PADDING CAST COTTON 4X4 STRL (CAST SUPPLIES)
PADDING CAST COTTON 6X4 STRL (CAST SUPPLIES) IMPLANT
PASSER SUT SWANSON 36MM LOOP (INSTRUMENTS) ×4 IMPLANT
PENCIL BUTTON HOLSTER BLD 10FT (ELECTRODE) ×4 IMPLANT
PIN CAPS ORTHO GREEN .062 (PIN) ×4 IMPLANT
SLING ARM FOAM STRAP LRG (SOFTGOODS) ×4 IMPLANT
SPONGE LAP 18X18 X RAY DECT (DISPOSABLE) ×8 IMPLANT
SPONGE LAP 4X18 X RAY DECT (DISPOSABLE) IMPLANT
STAPLER VISISTAT 35W (STAPLE) IMPLANT
STOCKINETTE IMPERVIOUS LG (DRAPES) ×4 IMPLANT
STRIP CLOSURE SKIN 1/2X4 (GAUZE/BANDAGES/DRESSINGS) ×6 IMPLANT
SUCTION FRAZIER HANDLE 10FR (MISCELLANEOUS) ×2
SUCTION TUBE FRAZIER 10FR DISP (MISCELLANEOUS) ×2 IMPLANT
SUT ETHIBOND 2 OS 4 DA (SUTURE) IMPLANT
SUT FIBERWIRE #2 38 T-5 BLUE (SUTURE) ×16
SUT FIBERWIRE #5 38 CONV NDL (SUTURE) ×8
SUT MNCRL AB 3-0 PS2 18 (SUTURE) ×4 IMPLANT
SUT MNCRL AB 4-0 PS2 18 (SUTURE) ×4 IMPLANT
SUT VIC AB 0 CT1 27 (SUTURE) ×4
SUT VIC AB 0 CT1 27XBRD ANBCTR (SUTURE) ×4 IMPLANT
SUT VIC AB 0 CT2 27 (SUTURE) ×4 IMPLANT
SUT VIC AB 1 CT1 27 (SUTURE) ×4
SUT VIC AB 1 CT1 27XBRD ANBCTR (SUTURE) ×4 IMPLANT
SUT VIC AB 2-0 CT1 27 (SUTURE) ×4
SUT VIC AB 2-0 CT1 TAPERPNT 27 (SUTURE) ×4 IMPLANT
SUTURE FIBERWR #2 38 T-5 BLUE (SUTURE) ×8 IMPLANT
SUTURE FIBERWR #5 38 CONV NDL (SUTURE) ×4 IMPLANT
SYR CONTROL 10ML LL (SYRINGE) ×4 IMPLANT
SYR TB 1ML LUER SLIP (SYRINGE) ×4 IMPLANT
TAPE CLOTH SURG 6X10 WHT LF (GAUZE/BANDAGES/DRESSINGS) ×4 IMPLANT
TOWEL OR 17X24 6PK STRL BLUE (TOWEL DISPOSABLE) ×4 IMPLANT
TOWEL OR 17X26 10 PK STRL BLUE (TOWEL DISPOSABLE) ×4 IMPLANT
TRAY FOLEY W/METER SILVER 16FR (SET/KITS/TRAYS/PACK) IMPLANT
TUBE CONNECTING 12'X1/4 (SUCTIONS) ×2
TUBE CONNECTING 12X1/4 (SUCTIONS) ×6 IMPLANT
WATER STERILE IRR 1000ML POUR (IV SOLUTION) IMPLANT
YANKAUER SUCT BULB TIP NO VENT (SUCTIONS) ×4 IMPLANT

## 2017-02-04 NOTE — Anesthesia Procedure Notes (Signed)
Procedure Name: Intubation Date/Time: 02/04/2017 1:20 PM Performed by: Lavell Luster Pre-anesthesia Checklist: Patient identified, Emergency Drugs available, Suction available and Patient being monitored Patient Re-evaluated:Patient Re-evaluated prior to inductionOxygen Delivery Method: Circle system utilized Preoxygenation: Pre-oxygenation with 100% oxygen Intubation Type: IV induction Ventilation: Mask ventilation without difficulty Laryngoscope Size: Mac and 4 Grade View: Grade I Tube type: Oral Tube size: 7.5 mm Number of attempts: 1 Airway Equipment and Method: Stylet Placement Confirmation: ETT inserted through vocal cords under direct vision and breath sounds checked- equal and bilateral Secured at: 23 cm Tube secured with: Tape Dental Injury: Teeth and Oropharynx as per pre-operative assessment
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
## 2017-02-04 NOTE — Discharge Instructions (Signed)
Ok to Weight bear as tolerated with the knee immobilizer securely on the left knee.  Elevate and ice the left knee when you can.  No ROM for the left knee..  Left shoulder needs to rest, remain in the sling and only move the wrist and the elbow to keep for getting stiff.  Keep dressings in place, will change this coming Tuesday in the office with Dr Veverly Fells (2PM appt)  No use of the left arm for anything.  Do not get the incision areas wet in the shower.  Ice to the left shoulder  Call for appt [phone]
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
## 2017-02-04 NOTE — Anesthesia Postprocedure Evaluation (Addendum)
Anesthesia Post Note  Patient: Drew Wyatt  Procedure(s) Performed: Procedure(s) (LRB): OPEN REDUCTION INTERNAL FIXATION (ORIF) CLAVICULAR FRACTURE (Left) REPAIR QUADRICEP TENDON (Left)  Patient location during evaluation: PACU Anesthesia Type: General Level of consciousness: awake Pain management: pain level controlled Vital Signs Assessment: post-procedure vital signs reviewed and stable Respiratory status: spontaneous breathing Cardiovascular status: stable Postop Assessment: no signs of nausea or vomiting Anesthetic complications: no Comments: L Adductor canal block placed in PACU without complication.        Last Vitals:  Vitals:   02/04/17 1154 02/04/17 1605  BP: 135/70 133/72  Pulse: 63   Resp: 20 14  Temp: 36.9 C 36.4 C    Last Pain: There were no vitals filed for this visit. Pain Goal: Patients Stated Pain Goal: 7 (02/04/17 1132)               Fidelis

## 2017-02-04 NOTE — Transfer of Care (Signed)
Immediate Anesthesia Transfer of Care Note  Patient: Drew Wyatt  Procedure(s) Performed: Procedure(s): OPEN REDUCTION INTERNAL FIXATION (ORIF) CLAVICULAR FRACTURE (Left) REPAIR QUADRICEP TENDON (Left)  Patient Location: PACU  Anesthesia Type:GA combined with regional for post-op pain  Level of Consciousness: awake and alert   Airway & Oxygen Therapy: Patient Spontanous Breathing and Patient connected to nasal cannula oxygen  Post-op Assessment: Report given to RN and Post -op Vital signs reviewed and stable  Post vital signs: Reviewed and stable  Last Vitals:  Vitals:   02/04/17 1154  BP: 135/70  Pulse: 63  Resp: 20  Temp: 36.9 C    Last Pain: There were no vitals filed for this visit.    Patients Stated Pain Goal: 7 (59/74/71 8550)  Complications: No apparent anesthesia complications

## 2017-02-04 NOTE — Anesthesia Preprocedure Evaluation (Addendum)
Anesthesia Evaluation  Patient identified by MRN, date of birth, ID band Patient awake    Reviewed: Allergy & Precautions, H&P , NPO status , Patient's Chart, lab work & pertinent test results  Airway Mallampati: II  TM Distance: >3 FB Neck ROM: full    Dental no notable dental hx. (+) Dental Advisory Given   Pulmonary former smoker,    Pulmonary exam normal breath sounds clear to auscultation       Cardiovascular Exercise Tolerance: Good negative cardio ROS Normal cardiovascular exam Rhythm:regular Rate:Normal     Neuro/Psych negative neurological ROS  negative psych ROS   GI/Hepatic negative GI ROS, Neg liver ROS,   Endo/Other  negative endocrine ROS  Renal/GU negative Renal ROS  negative genitourinary   Musculoskeletal negative musculoskeletal ROS (+)   Abdominal Normal abdominal exam  (+)   Peds  Hematology negative hematology ROS (+)   Anesthesia Other Findings EXERCISE TOLERANCE TEST  Order# [id number]  Reading physician: Sherren Mocha, MD Ordering physician: Sherren Mocha, MD Study date: 09/04/15 Patient Information   Name MRN Description Issacc Merlo [id number] 71 y.o. Male Vitals   Height Weight BMI (Calculated) 5' 11.5" (1.816 m) 183 lb (83 kg) 25.2 Study Highlights    There was no ST segment deviation noted during stress.      Reproductive/Obstetrics negative OB ROS                             Anesthesia Physical  Anesthesia Plan  ASA: II  Anesthesia Plan: General   Post-op Pain Management:  Regional for Post-op pain   Induction: Intravenous  Airway Management Planned: Oral ETT  Additional Equipment:   Intra-op Plan:   Post-operative Plan: Extubation in OR  Informed Consent: I have reviewed the patients History and Physical, chart, labs and discussed the procedure including the risks, benefits and alternatives for the proposed anesthesia with the  patient or authorized representative who has indicated his/her understanding and acceptance.   Dental Advisory Given  Plan Discussed with: CRNA and Surgeon  Anesthesia Plan Comments: (Will do Adductor Canal block for the quadriceps repair in the OR or PACU.)       Anesthesia Quick Evaluation

## 2017-02-04 NOTE — Anesthesia Procedure Notes (Addendum)
Anesthesia Regional Block: Adductor canal block   Pre-Anesthetic Checklist: ,, timeout performed, Correct Patient, Correct Site, Correct Laterality, Correct Procedure, Correct Position, site marked, Risks and benefits discussed,  Surgical consent,  Pre-op evaluation,  At surgeon's request and post-op pain management  Laterality: Left and Upper  Prep: chloraprep, Dura Prep       Needles:  Injection technique: Single-shot     Needle Length: 9cm  Needle Gauge: 21   Needle insertion depth: 4 cm   Additional Needles:   Procedures: ultrasound guided,,,,,,,,  Narrative:  Start time: 02/04/2017 4:20 PM End time: 02/04/2017 4:27 PM Injection made incrementally with aspirations every 5 mL.  Performed by: Personally  Anesthesiologist: Lyn Hollingshead

## 2017-02-05 MED ORDER — ENOXAPARIN (LOVENOX) PATIENT EDUCATION KIT
PACK | Freq: Once | Status: AC
  Administered 2017-02-05: 09:00:00
  Filled 2017-02-05: qty 1
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
## 2017-02-05 NOTE — Progress Notes (Signed)
Patient alert and oriented, mae's well, voiding adequate amount of urine, swallowing without difficulty, c/o moderate pain and meds given prior to discharged for ride and discomfort. Patient discharged home with family. Script and discharged instructions given to patient. Patient and family stated understanding of instructions given.   
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
## 2017-02-05 NOTE — Progress Notes (Signed)
   Subjective:  Patient reports pain as moderate.  Wants to go home.   Objective:   VITALS:   Vitals:   02/04/17 2002 02/04/17 2250 02/05/17 0407 02/05/17 0804  BP: 131/67 112/60 114/62 124/66  Pulse: 85 74 73 67  Resp: 18 18 18 18   Temp: 98.9 F (37.2 C) 98.9 F (37.2 C) 98.6 F (37 C) 99.1 F (37.3 C)  TempSrc: Oral Oral Oral   SpO2: 97% 99% 98% 97%  Weight:      Height:        NAD ABD soft Sensation intact distally Intact pulses distally Dorsiflexion/Plantar flexion intact Incision: dressing C/D/I Compartment soft   Lab Results  Component Value Date   WBC 10.7 (H) 02/04/2017   HGB 12.4 (L) 02/04/2017   HCT 38.1 (L) 02/04/2017   MCV 92.3 02/04/2017   PLT 143 (L) 02/04/2017   BMET    Component Value Date/Time   NA 144 10/19/2012 1326   K 4.9 10/19/2012 1326   CL 104 10/19/2012 1326   CO2 30 10/19/2012 1326   GLUCOSE 93 10/19/2012 1326   BUN 17 10/19/2012 1326   CREATININE 0.93 02/04/2017 1723   CALCIUM 10.0 10/19/2012 1326   GFRNONAA >60 02/04/2017 1723   GFRAA >60 02/04/2017 1723     Assessment/Plan: 1 Day Post-Op   Active Problems:   Quadriceps muscle rupture, left, initial encounter   WBAT LLE, knee immobilizer at all times NWB LUE, sling at all times - may don/doff for hygiene & axillary care DVT ppx: lovenox PO pain control PT/OT D/C home    Jahnyla Parrillo, Horald Pollen 02/05/2017, 8:27 AM   Rod Can, MD Cell [phone]

## 2017-02-07 ENCOUNTER — Encounter (HOSPITAL_COMMUNITY): Payer: Self-pay | Admitting: Orthopedic Surgery
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
## 2017-02-08 DIAGNOSIS — S76112D Strain of left quadriceps muscle, fascia and tendon, subsequent encounter: Secondary | ICD-10-CM | POA: Diagnosis not present

## 2017-02-08 DIAGNOSIS — S42032D Displaced fracture of lateral end of left clavicle, subsequent encounter for fracture with routine healing: Secondary | ICD-10-CM | POA: Diagnosis not present

## 2017-02-08 DIAGNOSIS — Z4789 Encounter for other orthopedic aftercare: Secondary | ICD-10-CM | POA: Diagnosis not present

## 2017-02-10 ENCOUNTER — Ambulatory Visit (INDEPENDENT_AMBULATORY_CARE_PROVIDER_SITE_OTHER)
Admission: RE | Admit: 2017-02-10 | Discharge: 2017-02-10 | Disposition: A | Discharge status: Home / Self Care | Payer: Medicare Other | Source: Ambulatory Visit | Attending: Cardiovascular Disease | Admitting: Cardiovascular Disease

## 2017-02-10 ENCOUNTER — Encounter: Payer: Self-pay | Admitting: Cardiovascular Disease

## 2017-02-10 ENCOUNTER — Ambulatory Visit (INDEPENDENT_AMBULATORY_CARE_PROVIDER_SITE_OTHER): Payer: Medicare Other | Admitting: Cardiovascular Disease

## 2017-02-10 VITALS — BP 120/60 | HR 73 | Ht 71.0 in | Wt 182.0 lb

## 2017-02-10 DIAGNOSIS — R42 Dizziness and giddiness: Secondary | ICD-10-CM

## 2017-02-10 DIAGNOSIS — H538 Other visual disturbances: Secondary | ICD-10-CM | POA: Diagnosis not present

## 2017-02-10 DIAGNOSIS — S0990XA Unspecified injury of head, initial encounter: Secondary | ICD-10-CM | POA: Diagnosis not present

## 2017-02-10 LAB — CBC
HEMATOCRIT: 33.2 % — AB (ref 37.5–51.0)
HEMOGLOBIN: 11.6 g/dL — AB (ref 13.0–17.7)
MCH: 31.7 pg (ref 26.6–33.0)
MCHC: 34.9 g/dL (ref 31.5–35.7)
MCV: 91 fL (ref 79–97)
PLATELETS: 266 10*3/uL (ref 150–379)
RBC: 3.66 x10E6/uL — AB (ref 4.14–5.80)
RDW: 13.6 % (ref 12.3–15.4)
WBC: 7.8 10*3/uL (ref 3.4–10.8)

## 2017-02-10 LAB — BASIC METABOLIC PANEL
BUN/Creatinine Ratio: 24 (ref 10–24)
BUN: 19 mg/dL (ref 8–27)
CALCIUM: 9.2 mg/dL (ref 8.6–10.2)
CO2: 25 mmol/L (ref 18–29)
CREATININE: 0.79 mg/dL (ref 0.76–1.27)
Chloride: 97 mmol/L (ref 96–106)
GFR calc Af Amer: 104 mL/min/{1.73_m2} (ref >59)
GFR, EST NON AFRICAN AMERICAN: 90 mL/min/{1.73_m2} (ref >59)
GLUCOSE: 109 mg/dL — AB (ref 65–99)
Potassium: 4 mmol/L (ref 3.5–5.2)
Sodium: 138 mmol/L (ref 134–144)

## 2017-02-10 NOTE — Patient Instructions (Signed)
Medication Instructions:  Your physician recommends that you continue on your current medications as directed. Please refer to the Current Medication list given to you today.  Labwork: Your physician recommends that you have lab work today: BMP and CBC  Testing/Procedures: Non-Cardiac CT scanning, (CAT scanning), is a noninvasive, special x-ray that produces cross-sectional images of the body using x-rays and a computer. CT scans help physicians diagnose and treat medical conditions. For some CT exams, a contrast material is used to enhance visibility in the area of the body being studied. CT scans provide greater clarity and reveal more details than regular x-ray exams. (CT head without constrast)  Follow-Up: Your physician recommends that you schedule a follow-up appointment as needed with Dr Burt Knack.    Any Other Special Instructions Will Be Listed Below (If Applicable).     If you need a refill on your cardiac medications before your next appointment, please call your pharmacy.
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
## 2017-02-23 DIAGNOSIS — Z4789 Encounter for other orthopedic aftercare: Secondary | ICD-10-CM | POA: Diagnosis not present

## 2017-02-23 DIAGNOSIS — S76112D Strain of left quadriceps muscle, fascia and tendon, subsequent encounter: Secondary | ICD-10-CM | POA: Diagnosis not present

## 2017-02-23 DIAGNOSIS — S42032D Displaced fracture of lateral end of left clavicle, subsequent encounter for fracture with routine healing: Secondary | ICD-10-CM | POA: Diagnosis not present

## 2017-02-26 DIAGNOSIS — H8112 Benign paroxysmal vertigo, left ear: Secondary | ICD-10-CM | POA: Diagnosis not present

## 2017-02-26 DIAGNOSIS — H6123 Impacted cerumen, bilateral: Secondary | ICD-10-CM | POA: Diagnosis not present

## 2017-03-04 NOTE — Addendum Note (Signed)
Addendum  created 03/04/17 1232 by Lyn Hollingshead, MD   Sign clinical note

## 2017-03-10 DIAGNOSIS — S42032D Displaced fracture of lateral end of left clavicle, subsequent encounter for fracture with routine healing: Secondary | ICD-10-CM | POA: Diagnosis not present

## 2017-03-10 DIAGNOSIS — Z4789 Encounter for other orthopedic aftercare: Secondary | ICD-10-CM | POA: Diagnosis not present

## 2017-03-10 DIAGNOSIS — S76112D Strain of left quadriceps muscle, fascia and tendon, subsequent encounter: Secondary | ICD-10-CM | POA: Diagnosis not present

## 2017-03-16 DIAGNOSIS — M25562 Pain in left knee: Secondary | ICD-10-CM | POA: Diagnosis not present

## 2017-03-22 DIAGNOSIS — L821 Other seborrheic keratosis: Secondary | ICD-10-CM | POA: Diagnosis not present

## 2017-03-22 DIAGNOSIS — L218 Other seborrheic dermatitis: Secondary | ICD-10-CM | POA: Diagnosis not present

## 2017-03-23 DIAGNOSIS — Z4789 Encounter for other orthopedic aftercare: Secondary | ICD-10-CM | POA: Diagnosis not present

## 2017-03-23 DIAGNOSIS — S76112D Strain of left quadriceps muscle, fascia and tendon, subsequent encounter: Secondary | ICD-10-CM | POA: Diagnosis not present

## 2017-03-23 DIAGNOSIS — S42032D Displaced fracture of lateral end of left clavicle, subsequent encounter for fracture with routine healing: Secondary | ICD-10-CM | POA: Diagnosis not present

## 2017-03-24 DIAGNOSIS — M25562 Pain in left knee: Secondary | ICD-10-CM | POA: Diagnosis not present

## 2017-03-29 DIAGNOSIS — M25562 Pain in left knee: Secondary | ICD-10-CM | POA: Diagnosis not present

## 2017-03-31 DIAGNOSIS — M25562 Pain in left knee: Secondary | ICD-10-CM | POA: Diagnosis not present

## 2017-04-05 DIAGNOSIS — M25562 Pain in left knee: Secondary | ICD-10-CM | POA: Diagnosis not present

## 2017-04-07 DIAGNOSIS — M25562 Pain in left knee: Secondary | ICD-10-CM | POA: Diagnosis not present

## 2017-04-12 DIAGNOSIS — M25562 Pain in left knee: Secondary | ICD-10-CM | POA: Diagnosis not present

## 2017-04-14 DIAGNOSIS — M25562 Pain in left knee: Secondary | ICD-10-CM | POA: Diagnosis not present

## 2017-04-19 DIAGNOSIS — M25562 Pain in left knee: Secondary | ICD-10-CM | POA: Diagnosis not present

## 2017-04-20 DIAGNOSIS — Z4789 Encounter for other orthopedic aftercare: Secondary | ICD-10-CM | POA: Diagnosis not present

## 2017-04-20 DIAGNOSIS — S42032D Displaced fracture of lateral end of left clavicle, subsequent encounter for fracture with routine healing: Secondary | ICD-10-CM | POA: Diagnosis not present

## 2017-04-20 DIAGNOSIS — S76112D Strain of left quadriceps muscle, fascia and tendon, subsequent encounter: Secondary | ICD-10-CM | POA: Diagnosis not present

## 2017-04-27 DIAGNOSIS — M25562 Pain in left knee: Secondary | ICD-10-CM | POA: Diagnosis not present

## 2017-05-02 DIAGNOSIS — M25562 Pain in left knee: Secondary | ICD-10-CM | POA: Diagnosis not present

## 2017-05-11 DIAGNOSIS — M25562 Pain in left knee: Secondary | ICD-10-CM | POA: Diagnosis not present

## 2017-05-17 DIAGNOSIS — B351 Tinea unguium: Secondary | ICD-10-CM | POA: Diagnosis not present

## 2017-05-17 DIAGNOSIS — L821 Other seborrheic keratosis: Secondary | ICD-10-CM | POA: Diagnosis not present

## 2017-05-17 DIAGNOSIS — D225 Melanocytic nevi of trunk: Secondary | ICD-10-CM | POA: Diagnosis not present

## 2017-05-18 DIAGNOSIS — M25562 Pain in left knee: Secondary | ICD-10-CM | POA: Diagnosis not present

## 2017-05-23 DIAGNOSIS — M25562 Pain in left knee: Secondary | ICD-10-CM | POA: Diagnosis not present

## 2017-05-24 DIAGNOSIS — S42032D Displaced fracture of lateral end of left clavicle, subsequent encounter for fracture with routine healing: Secondary | ICD-10-CM | POA: Diagnosis not present

## 2017-05-24 DIAGNOSIS — Z4789 Encounter for other orthopedic aftercare: Secondary | ICD-10-CM | POA: Diagnosis not present

## 2017-05-24 DIAGNOSIS — S76112D Strain of left quadriceps muscle, fascia and tendon, subsequent encounter: Secondary | ICD-10-CM | POA: Diagnosis not present

## 2017-05-25 DIAGNOSIS — Z9841 Cataract extraction status, right eye: Secondary | ICD-10-CM | POA: Diagnosis not present

## 2017-05-25 DIAGNOSIS — G43109 Migraine with aura, not intractable, without status migrainosus: Secondary | ICD-10-CM | POA: Diagnosis not present

## 2017-05-25 DIAGNOSIS — Z9842 Cataract extraction status, left eye: Secondary | ICD-10-CM | POA: Diagnosis not present

## 2017-05-25 DIAGNOSIS — H04203 Unspecified epiphora, bilateral lacrimal glands: Secondary | ICD-10-CM | POA: Diagnosis not present

## 2017-05-25 DIAGNOSIS — Z961 Presence of intraocular lens: Secondary | ICD-10-CM | POA: Diagnosis not present

## 2017-07-05 DIAGNOSIS — S76112D Strain of left quadriceps muscle, fascia and tendon, subsequent encounter: Secondary | ICD-10-CM | POA: Diagnosis not present

## 2017-07-05 DIAGNOSIS — S42032D Displaced fracture of lateral end of left clavicle, subsequent encounter for fracture with routine healing: Secondary | ICD-10-CM | POA: Diagnosis not present

## 2017-07-11 DIAGNOSIS — Z0000 Encounter for general adult medical examination without abnormal findings: Secondary | ICD-10-CM | POA: Diagnosis not present

## 2017-07-11 DIAGNOSIS — Z23 Encounter for immunization: Secondary | ICD-10-CM | POA: Diagnosis not present

## 2017-07-11 DIAGNOSIS — Z125 Encounter for screening for malignant neoplasm of prostate: Secondary | ICD-10-CM | POA: Diagnosis not present

## 2017-07-11 DIAGNOSIS — F419 Anxiety disorder, unspecified: Secondary | ICD-10-CM | POA: Diagnosis not present

## 2017-07-11 DIAGNOSIS — R7301 Impaired fasting glucose: Secondary | ICD-10-CM | POA: Diagnosis not present

## 2017-07-11 DIAGNOSIS — R1013 Epigastric pain: Secondary | ICD-10-CM | POA: Diagnosis not present

## 2017-07-11 DIAGNOSIS — Z7189 Other specified counseling: Secondary | ICD-10-CM | POA: Diagnosis not present

## 2017-07-11 DIAGNOSIS — M1000 Idiopathic gout, unspecified site: Secondary | ICD-10-CM | POA: Diagnosis not present

## 2017-07-11 DIAGNOSIS — E782 Mixed hyperlipidemia: Secondary | ICD-10-CM | POA: Diagnosis not present

## 2017-07-11 DIAGNOSIS — N529 Male erectile dysfunction, unspecified: Secondary | ICD-10-CM | POA: Diagnosis not present

## 2017-07-11 DIAGNOSIS — Z86010 Personal history of colonic polyps: Secondary | ICD-10-CM | POA: Diagnosis not present

## 2017-07-13 DIAGNOSIS — Z1389 Encounter for screening for other disorder: Secondary | ICD-10-CM | POA: Diagnosis not present

## 2017-07-13 DIAGNOSIS — Z0000 Encounter for general adult medical examination without abnormal findings: Secondary | ICD-10-CM | POA: Diagnosis not present

## 2017-07-13 DIAGNOSIS — Z23 Encounter for immunization: Secondary | ICD-10-CM | POA: Diagnosis not present

## 2017-07-13 DIAGNOSIS — E782 Mixed hyperlipidemia: Secondary | ICD-10-CM | POA: Diagnosis not present

## 2017-07-13 DIAGNOSIS — Z86010 Personal history of colonic polyps: Secondary | ICD-10-CM | POA: Diagnosis not present

## 2017-07-13 DIAGNOSIS — F418 Other specified anxiety disorders: Secondary | ICD-10-CM | POA: Diagnosis not present

## 2017-07-13 DIAGNOSIS — M1000 Idiopathic gout, unspecified site: Secondary | ICD-10-CM | POA: Diagnosis not present

## 2017-07-13 DIAGNOSIS — G479 Sleep disorder, unspecified: Secondary | ICD-10-CM | POA: Diagnosis not present

## 2017-07-13 DIAGNOSIS — R7301 Impaired fasting glucose: Secondary | ICD-10-CM | POA: Diagnosis not present

## 2017-07-13 DIAGNOSIS — N529 Male erectile dysfunction, unspecified: Secondary | ICD-10-CM | POA: Diagnosis not present

## 2017-07-13 DIAGNOSIS — Z125 Encounter for screening for malignant neoplasm of prostate: Secondary | ICD-10-CM | POA: Diagnosis not present

## 2017-10-13 DIAGNOSIS — R109 Unspecified abdominal pain: Secondary | ICD-10-CM | POA: Diagnosis not present

## 2017-10-13 DIAGNOSIS — N410 Acute prostatitis: Secondary | ICD-10-CM | POA: Diagnosis not present

## 2017-10-13 DIAGNOSIS — Z23 Encounter for immunization: Secondary | ICD-10-CM | POA: Diagnosis not present

## 2017-11-04 DIAGNOSIS — R1030 Lower abdominal pain, unspecified: Secondary | ICD-10-CM | POA: Diagnosis not present

## 2017-11-22 IMAGING — DX DG SHOULDER 2+V*L*
2 series · 2 of 2 positions shown · non-contrast
Comparison: None.

CLINICAL DATA: Fall

EXAM:
LEFT SHOULDER - 2+ VIEW

[t shoulder internal left]
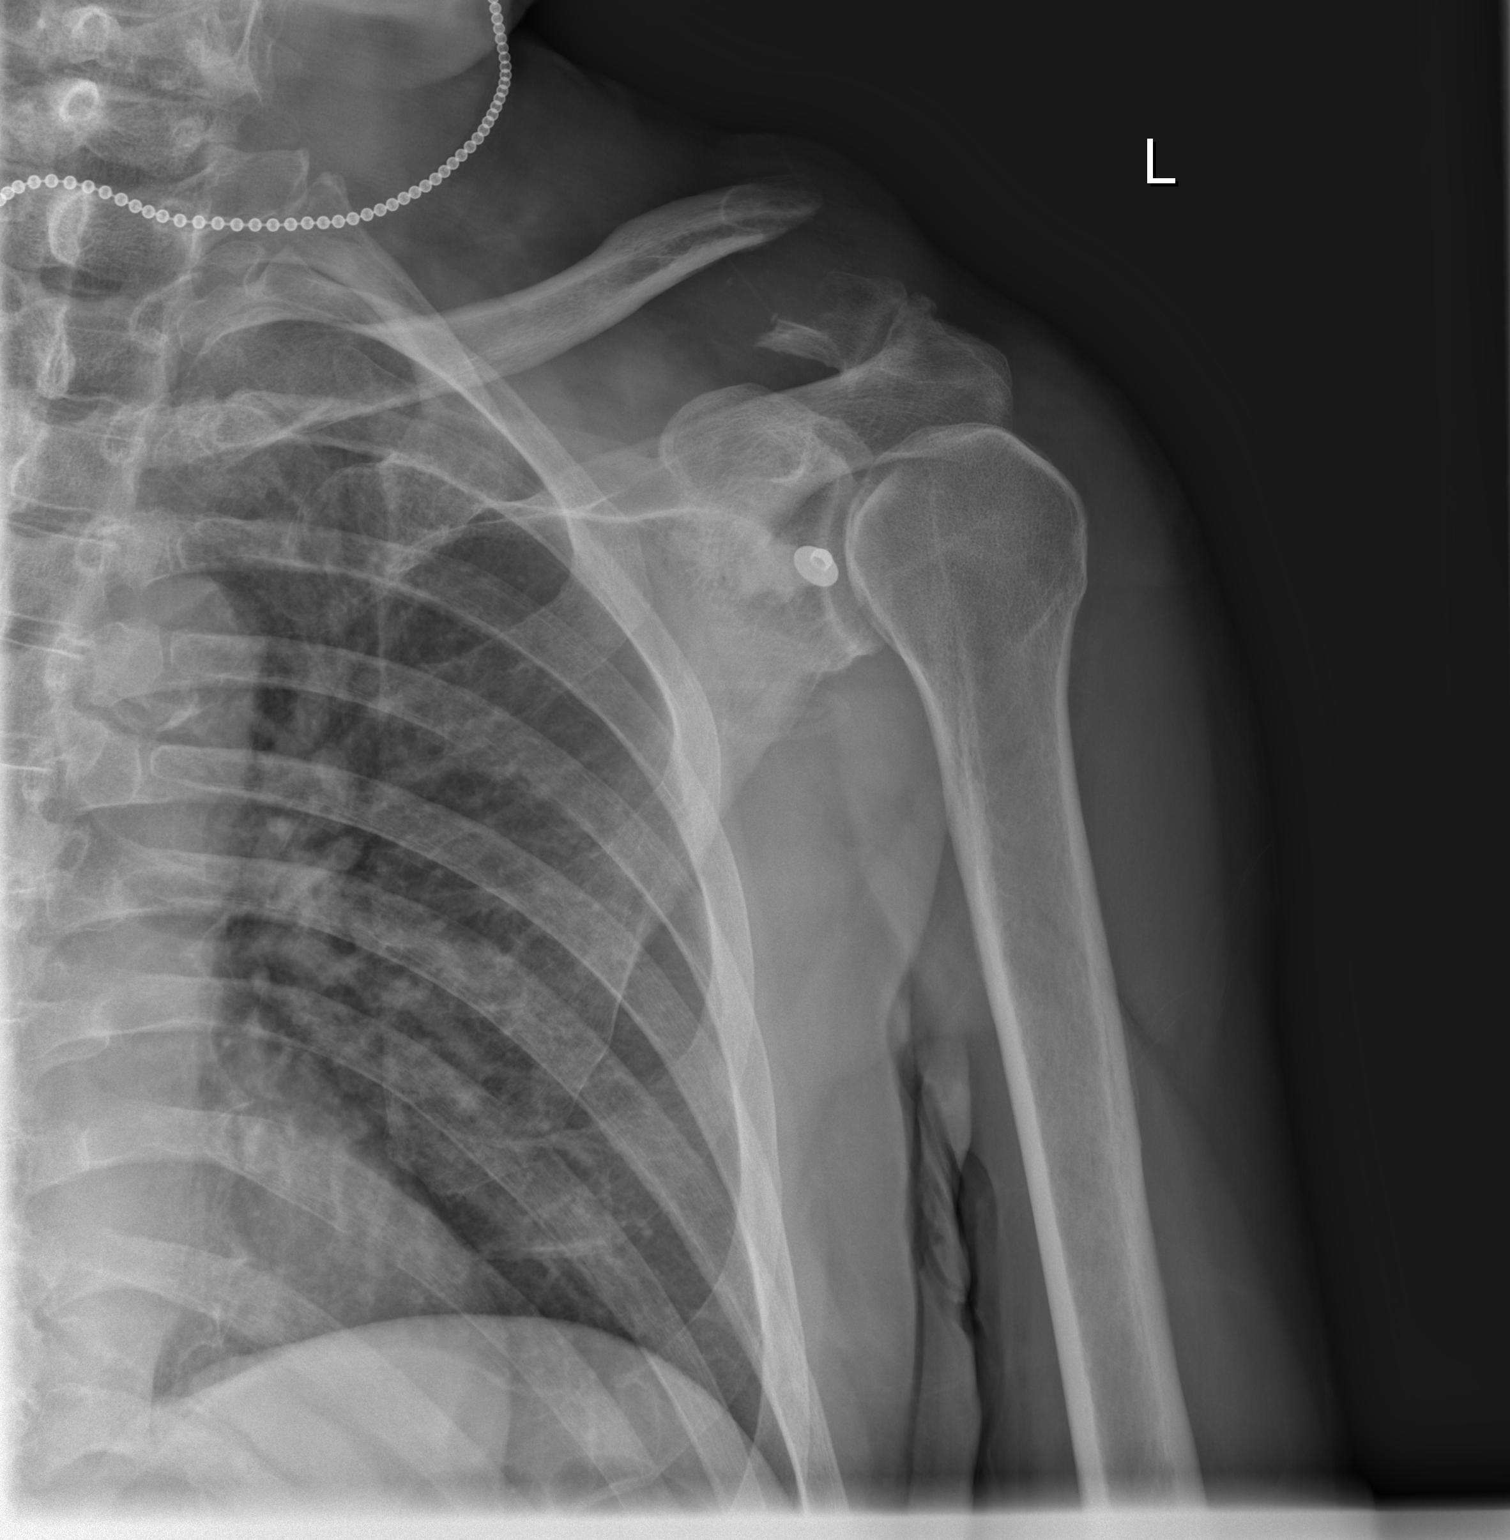

[t shoulder external left]
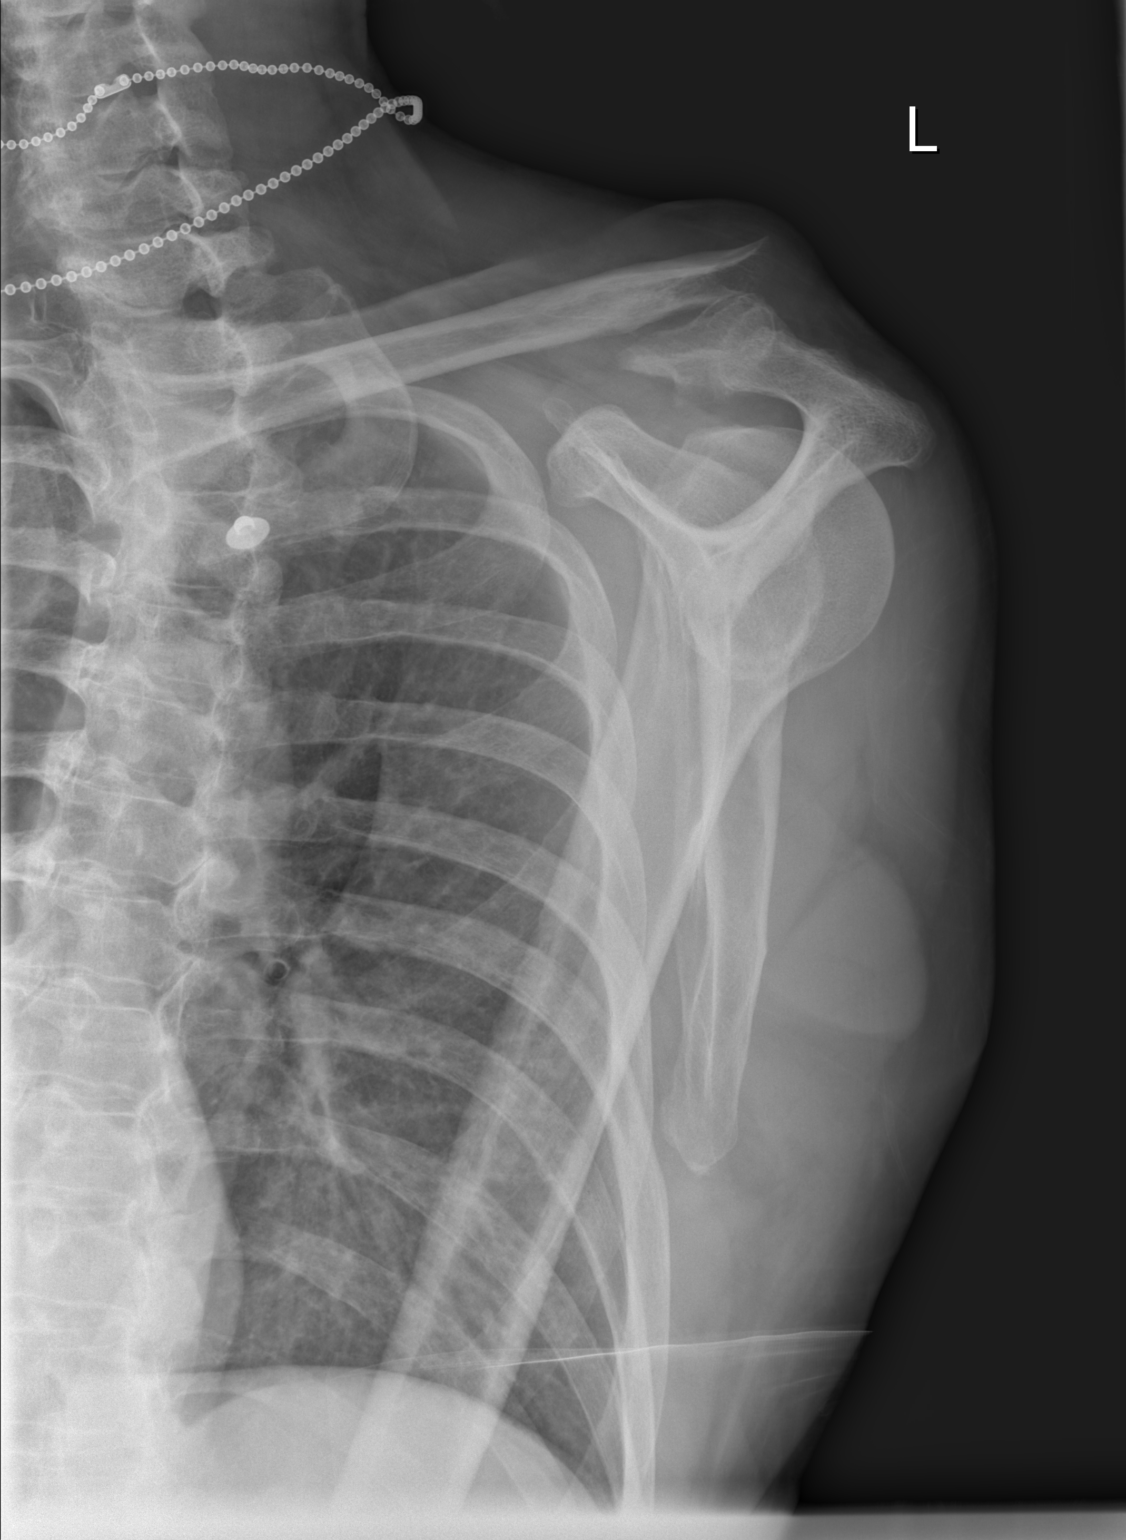

[2 of 2 positions shown; findings below may reference images not displayed]

FINDINGS: There is a a fracture through the peripheral aspect of the clavicle.
There is marked superior displacement of the medial fracture
fragment. The AC joint remains intact with the small clavicle
fragment. The clavicle coracoid distance is widened suggesting
injury to this ligament.
IMPRESSION: Markedly displaced peripheral clavicle fracture.

Coracoid clavicular ligament injury is suspected.

## 2018-01-03 DIAGNOSIS — L218 Other seborrheic dermatitis: Secondary | ICD-10-CM | POA: Diagnosis not present

## 2018-01-03 DIAGNOSIS — D2262 Melanocytic nevi of left upper limb, including shoulder: Secondary | ICD-10-CM | POA: Diagnosis not present

## 2018-01-03 DIAGNOSIS — L718 Other rosacea: Secondary | ICD-10-CM | POA: Diagnosis not present

## 2018-01-03 DIAGNOSIS — L821 Other seborrheic keratosis: Secondary | ICD-10-CM | POA: Diagnosis not present

## 2018-01-03 DIAGNOSIS — L820 Inflamed seborrheic keratosis: Secondary | ICD-10-CM | POA: Diagnosis not present

## 2018-01-03 DIAGNOSIS — D224 Melanocytic nevi of scalp and neck: Secondary | ICD-10-CM | POA: Diagnosis not present

## 2018-01-03 DIAGNOSIS — B353 Tinea pedis: Secondary | ICD-10-CM | POA: Diagnosis not present

## 2018-01-04 DIAGNOSIS — R1030 Lower abdominal pain, unspecified: Secondary | ICD-10-CM | POA: Diagnosis not present

## 2018-06-07 DIAGNOSIS — Z9841 Cataract extraction status, right eye: Secondary | ICD-10-CM | POA: Diagnosis not present

## 2018-06-07 DIAGNOSIS — Z961 Presence of intraocular lens: Secondary | ICD-10-CM | POA: Diagnosis not present

## 2018-06-07 DIAGNOSIS — G43109 Migraine with aura, not intractable, without status migrainosus: Secondary | ICD-10-CM | POA: Diagnosis not present

## 2018-06-07 DIAGNOSIS — Z9842 Cataract extraction status, left eye: Secondary | ICD-10-CM | POA: Diagnosis not present

## 2018-06-27 DIAGNOSIS — M4692 Unspecified inflammatory spondylopathy, cervical region: Secondary | ICD-10-CM | POA: Diagnosis not present

## 2018-06-27 DIAGNOSIS — M5030 Other cervical disc degeneration, unspecified cervical region: Secondary | ICD-10-CM | POA: Diagnosis not present

## 2018-06-27 DIAGNOSIS — M542 Cervicalgia: Secondary | ICD-10-CM | POA: Diagnosis not present

## 2018-07-10 DIAGNOSIS — R7301 Impaired fasting glucose: Secondary | ICD-10-CM | POA: Diagnosis not present

## 2018-07-10 DIAGNOSIS — F418 Other specified anxiety disorders: Secondary | ICD-10-CM | POA: Diagnosis not present

## 2018-07-10 DIAGNOSIS — R109 Unspecified abdominal pain: Secondary | ICD-10-CM | POA: Diagnosis not present

## 2018-07-10 DIAGNOSIS — E782 Mixed hyperlipidemia: Secondary | ICD-10-CM | POA: Diagnosis not present

## 2018-07-10 DIAGNOSIS — Z125 Encounter for screening for malignant neoplasm of prostate: Secondary | ICD-10-CM | POA: Diagnosis not present

## 2018-07-10 DIAGNOSIS — Z86010 Personal history of colonic polyps: Secondary | ICD-10-CM | POA: Diagnosis not present

## 2018-07-10 DIAGNOSIS — G479 Sleep disorder, unspecified: Secondary | ICD-10-CM | POA: Diagnosis not present

## 2018-07-10 DIAGNOSIS — M1000 Idiopathic gout, unspecified site: Secondary | ICD-10-CM | POA: Diagnosis not present

## 2018-07-10 DIAGNOSIS — Z23 Encounter for immunization: Secondary | ICD-10-CM | POA: Diagnosis not present

## 2018-07-10 DIAGNOSIS — Z1389 Encounter for screening for other disorder: Secondary | ICD-10-CM | POA: Diagnosis not present

## 2018-07-10 DIAGNOSIS — Z0000 Encounter for general adult medical examination without abnormal findings: Secondary | ICD-10-CM | POA: Diagnosis not present

## 2018-07-10 DIAGNOSIS — N529 Male erectile dysfunction, unspecified: Secondary | ICD-10-CM | POA: Diagnosis not present

## 2018-07-11 DIAGNOSIS — M542 Cervicalgia: Secondary | ICD-10-CM | POA: Diagnosis not present

## 2018-07-14 DIAGNOSIS — E782 Mixed hyperlipidemia: Secondary | ICD-10-CM | POA: Diagnosis not present

## 2018-07-14 DIAGNOSIS — G43109 Migraine with aura, not intractable, without status migrainosus: Secondary | ICD-10-CM | POA: Diagnosis not present

## 2018-07-14 DIAGNOSIS — M1000 Idiopathic gout, unspecified site: Secondary | ICD-10-CM | POA: Diagnosis not present

## 2018-07-14 DIAGNOSIS — F418 Other specified anxiety disorders: Secondary | ICD-10-CM | POA: Diagnosis not present

## 2018-07-14 DIAGNOSIS — G250 Essential tremor: Secondary | ICD-10-CM | POA: Diagnosis not present

## 2018-07-14 DIAGNOSIS — Z1389 Encounter for screening for other disorder: Secondary | ICD-10-CM | POA: Diagnosis not present

## 2018-07-14 DIAGNOSIS — Z0000 Encounter for general adult medical examination without abnormal findings: Secondary | ICD-10-CM | POA: Diagnosis not present

## 2018-07-14 DIAGNOSIS — Z23 Encounter for immunization: Secondary | ICD-10-CM | POA: Diagnosis not present

## 2018-07-14 DIAGNOSIS — N529 Male erectile dysfunction, unspecified: Secondary | ICD-10-CM | POA: Diagnosis not present

## 2018-07-14 DIAGNOSIS — Z125 Encounter for screening for malignant neoplasm of prostate: Secondary | ICD-10-CM | POA: Diagnosis not present

## 2018-07-14 DIAGNOSIS — R7301 Impaired fasting glucose: Secondary | ICD-10-CM | POA: Diagnosis not present

## 2018-07-21 DIAGNOSIS — G43109 Migraine with aura, not intractable, without status migrainosus: Secondary | ICD-10-CM | POA: Diagnosis not present

## 2018-07-21 DIAGNOSIS — H02833 Dermatochalasis of right eye, unspecified eyelid: Secondary | ICD-10-CM | POA: Diagnosis not present

## 2018-07-21 DIAGNOSIS — Z9842 Cataract extraction status, left eye: Secondary | ICD-10-CM | POA: Diagnosis not present

## 2018-07-21 DIAGNOSIS — Z961 Presence of intraocular lens: Secondary | ICD-10-CM | POA: Diagnosis not present

## 2018-07-21 DIAGNOSIS — H04123 Dry eye syndrome of bilateral lacrimal glands: Secondary | ICD-10-CM | POA: Diagnosis not present

## 2018-07-21 DIAGNOSIS — H02836 Dermatochalasis of left eye, unspecified eyelid: Secondary | ICD-10-CM | POA: Diagnosis not present

## 2018-07-21 DIAGNOSIS — H02401 Unspecified ptosis of right eyelid: Secondary | ICD-10-CM | POA: Diagnosis not present

## 2018-07-21 DIAGNOSIS — Z9841 Cataract extraction status, right eye: Secondary | ICD-10-CM | POA: Diagnosis not present

## 2018-07-25 DIAGNOSIS — M542 Cervicalgia: Secondary | ICD-10-CM | POA: Diagnosis not present

## 2018-11-16 DIAGNOSIS — M170 Bilateral primary osteoarthritis of knee: Secondary | ICD-10-CM | POA: Diagnosis not present

## 2018-11-16 DIAGNOSIS — M25561 Pain in right knee: Secondary | ICD-10-CM | POA: Diagnosis not present

## 2019-01-10 DIAGNOSIS — R2241 Localized swelling, mass and lump, right lower limb: Secondary | ICD-10-CM | POA: Diagnosis not present

## 2019-01-10 DIAGNOSIS — M1711 Unilateral primary osteoarthritis, right knee: Secondary | ICD-10-CM | POA: Diagnosis not present

## 2019-02-28 DIAGNOSIS — D2272 Melanocytic nevi of left lower limb, including hip: Secondary | ICD-10-CM | POA: Diagnosis not present

## 2019-02-28 DIAGNOSIS — C44719 Basal cell carcinoma of skin of left lower limb, including hip: Secondary | ICD-10-CM | POA: Diagnosis not present

## 2019-02-28 DIAGNOSIS — D1801 Hemangioma of skin and subcutaneous tissue: Secondary | ICD-10-CM | POA: Diagnosis not present

## 2019-02-28 DIAGNOSIS — D2261 Melanocytic nevi of right upper limb, including shoulder: Secondary | ICD-10-CM | POA: Diagnosis not present

## 2019-02-28 DIAGNOSIS — D225 Melanocytic nevi of trunk: Secondary | ICD-10-CM | POA: Diagnosis not present

## 2019-02-28 DIAGNOSIS — D485 Neoplasm of uncertain behavior of skin: Secondary | ICD-10-CM | POA: Diagnosis not present

## 2019-02-28 DIAGNOSIS — B351 Tinea unguium: Secondary | ICD-10-CM | POA: Diagnosis not present

## 2019-02-28 DIAGNOSIS — D2271 Melanocytic nevi of right lower limb, including hip: Secondary | ICD-10-CM | POA: Diagnosis not present

## 2019-02-28 DIAGNOSIS — L905 Scar conditions and fibrosis of skin: Secondary | ICD-10-CM | POA: Diagnosis not present

## 2019-02-28 DIAGNOSIS — M7989 Other specified soft tissue disorders: Secondary | ICD-10-CM | POA: Diagnosis not present

## 2019-02-28 DIAGNOSIS — L718 Other rosacea: Secondary | ICD-10-CM | POA: Diagnosis not present

## 2019-02-28 DIAGNOSIS — D692 Other nonthrombocytopenic purpura: Secondary | ICD-10-CM | POA: Diagnosis not present

## 2019-02-28 DIAGNOSIS — L821 Other seborrheic keratosis: Secondary | ICD-10-CM | POA: Diagnosis not present

## 2019-03-12 DIAGNOSIS — L570 Actinic keratosis: Secondary | ICD-10-CM | POA: Diagnosis not present

## 2019-05-03 DIAGNOSIS — M25551 Pain in right hip: Secondary | ICD-10-CM | POA: Diagnosis not present

## 2019-06-12 DIAGNOSIS — M546 Pain in thoracic spine: Secondary | ICD-10-CM | POA: Diagnosis not present

## 2019-06-12 DIAGNOSIS — Z23 Encounter for immunization: Secondary | ICD-10-CM | POA: Diagnosis not present

## 2019-07-23 DIAGNOSIS — H04123 Dry eye syndrome of bilateral lacrimal glands: Secondary | ICD-10-CM | POA: Diagnosis not present

## 2019-07-23 DIAGNOSIS — Z9842 Cataract extraction status, left eye: Secondary | ICD-10-CM | POA: Diagnosis not present

## 2019-07-23 DIAGNOSIS — Z9841 Cataract extraction status, right eye: Secondary | ICD-10-CM | POA: Diagnosis not present

## 2019-07-23 DIAGNOSIS — Z9889 Other specified postprocedural states: Secondary | ICD-10-CM | POA: Diagnosis not present

## 2019-07-23 DIAGNOSIS — Z961 Presence of intraocular lens: Secondary | ICD-10-CM | POA: Diagnosis not present

## 2019-07-23 DIAGNOSIS — G43109 Migraine with aura, not intractable, without status migrainosus: Secondary | ICD-10-CM | POA: Diagnosis not present

## 2019-07-25 DIAGNOSIS — E782 Mixed hyperlipidemia: Secondary | ICD-10-CM | POA: Diagnosis not present

## 2019-07-25 DIAGNOSIS — G43109 Migraine with aura, not intractable, without status migrainosus: Secondary | ICD-10-CM | POA: Diagnosis not present

## 2019-07-25 DIAGNOSIS — F418 Other specified anxiety disorders: Secondary | ICD-10-CM | POA: Diagnosis not present

## 2019-07-25 DIAGNOSIS — Z0000 Encounter for general adult medical examination without abnormal findings: Secondary | ICD-10-CM | POA: Diagnosis not present

## 2019-07-25 DIAGNOSIS — Z125 Encounter for screening for malignant neoplasm of prostate: Secondary | ICD-10-CM | POA: Diagnosis not present

## 2019-07-25 DIAGNOSIS — Z1389 Encounter for screening for other disorder: Secondary | ICD-10-CM | POA: Diagnosis not present

## 2019-07-25 DIAGNOSIS — Z23 Encounter for immunization: Secondary | ICD-10-CM | POA: Diagnosis not present

## 2019-07-25 DIAGNOSIS — M1000 Idiopathic gout, unspecified site: Secondary | ICD-10-CM | POA: Diagnosis not present

## 2019-07-25 DIAGNOSIS — G250 Essential tremor: Secondary | ICD-10-CM | POA: Diagnosis not present

## 2019-07-25 DIAGNOSIS — R7301 Impaired fasting glucose: Secondary | ICD-10-CM | POA: Diagnosis not present

## 2019-11-04 ENCOUNTER — Ambulatory Visit: Payer: Medicare Other

## 2019-11-21 ENCOUNTER — Ambulatory Visit: Payer: Medicare Other

## 2020-01-22 DIAGNOSIS — D224 Melanocytic nevi of scalp and neck: Secondary | ICD-10-CM | POA: Diagnosis not present

## 2020-01-22 DIAGNOSIS — D225 Melanocytic nevi of trunk: Secondary | ICD-10-CM | POA: Diagnosis not present

## 2020-01-22 DIAGNOSIS — Z85828 Personal history of other malignant neoplasm of skin: Secondary | ICD-10-CM | POA: Diagnosis not present

## 2020-01-22 DIAGNOSIS — D1801 Hemangioma of skin and subcutaneous tissue: Secondary | ICD-10-CM | POA: Diagnosis not present

## 2020-01-22 DIAGNOSIS — L718 Other rosacea: Secondary | ICD-10-CM | POA: Diagnosis not present

## 2020-01-22 DIAGNOSIS — L821 Other seborrheic keratosis: Secondary | ICD-10-CM | POA: Diagnosis not present

## 2020-01-22 DIAGNOSIS — D2272 Melanocytic nevi of left lower limb, including hip: Secondary | ICD-10-CM | POA: Diagnosis not present

## 2020-01-22 DIAGNOSIS — L84 Corns and callosities: Secondary | ICD-10-CM | POA: Diagnosis not present

## 2020-01-22 DIAGNOSIS — D2271 Melanocytic nevi of right lower limb, including hip: Secondary | ICD-10-CM | POA: Diagnosis not present

## 2020-04-09 ENCOUNTER — Telehealth: Payer: Self-pay | Admitting: Internal Medicine

## 2020-04-09 NOTE — Telephone Encounter (Signed)
Hey Dr. Hilarie Fredrickson, we received the records for this patient, hes last colonoscopy was in 2017. He states that he is going to be due at the beginning of next year and wanted to transfer hes care to you from [facility]. States that [employer] doctor at [facility] retired and heard good things about you. I have hes records and will send them up to you for review. Please advise. Thank you!

## 2020-04-10 NOTE — Telephone Encounter (Signed)
Left voicemail for patient to call back. I have entered recall in our [facility] system for colonoscopy 11/2020.

## 2020-04-10 NOTE — Telephone Encounter (Signed)
Pt is aware of recall colonoscopy 11/2020

## 2020-04-10 NOTE — Telephone Encounter (Signed)
Tubular adenoma of the rectum in March 2017 on colonoscopy with Dr. Amedeo Plenty Agree with recommendation repeat colonoscopy in March 0722 Certainly okay to join my practice We will have records specifically colonoscopy, upper endoscopy and pathology records scanned into our EMR

## 2020-05-28 DIAGNOSIS — G43109 Migraine with aura, not intractable, without status migrainosus: Secondary | ICD-10-CM | POA: Diagnosis not present

## 2020-05-28 DIAGNOSIS — Z9842 Cataract extraction status, left eye: Secondary | ICD-10-CM | POA: Diagnosis not present

## 2020-05-28 DIAGNOSIS — H04123 Dry eye syndrome of bilateral lacrimal glands: Secondary | ICD-10-CM | POA: Diagnosis not present

## 2020-05-28 DIAGNOSIS — Z961 Presence of intraocular lens: Secondary | ICD-10-CM | POA: Diagnosis not present

## 2020-06-03 DIAGNOSIS — Z23 Encounter for immunization: Secondary | ICD-10-CM | POA: Diagnosis not present

## 2020-06-13 DIAGNOSIS — Z23 Encounter for immunization: Secondary | ICD-10-CM | POA: Diagnosis not present

## 2020-08-06 DIAGNOSIS — E782 Mixed hyperlipidemia: Secondary | ICD-10-CM | POA: Diagnosis not present

## 2020-08-06 DIAGNOSIS — R7309 Other abnormal glucose: Secondary | ICD-10-CM | POA: Diagnosis not present

## 2020-08-06 DIAGNOSIS — Z125 Encounter for screening for malignant neoplasm of prostate: Secondary | ICD-10-CM | POA: Diagnosis not present

## 2020-08-06 DIAGNOSIS — M1000 Idiopathic gout, unspecified site: Secondary | ICD-10-CM | POA: Diagnosis not present

## 2020-08-12 DIAGNOSIS — R7309 Other abnormal glucose: Secondary | ICD-10-CM | POA: Diagnosis not present

## 2020-08-12 DIAGNOSIS — G250 Essential tremor: Secondary | ICD-10-CM | POA: Diagnosis not present

## 2020-08-12 DIAGNOSIS — Z0000 Encounter for general adult medical examination without abnormal findings: Secondary | ICD-10-CM | POA: Diagnosis not present

## 2020-08-12 DIAGNOSIS — N529 Male erectile dysfunction, unspecified: Secondary | ICD-10-CM | POA: Diagnosis not present

## 2020-08-12 DIAGNOSIS — Z79899 Other long term (current) drug therapy: Secondary | ICD-10-CM | POA: Diagnosis not present

## 2020-08-12 DIAGNOSIS — G43109 Migraine with aura, not intractable, without status migrainosus: Secondary | ICD-10-CM | POA: Diagnosis not present

## 2020-08-12 DIAGNOSIS — E782 Mixed hyperlipidemia: Secondary | ICD-10-CM | POA: Diagnosis not present

## 2020-08-12 DIAGNOSIS — Z1389 Encounter for screening for other disorder: Secondary | ICD-10-CM | POA: Diagnosis not present

## 2020-08-12 DIAGNOSIS — F418 Other specified anxiety disorders: Secondary | ICD-10-CM | POA: Diagnosis not present

## 2020-08-12 DIAGNOSIS — M1000 Idiopathic gout, unspecified site: Secondary | ICD-10-CM | POA: Diagnosis not present

## 2020-11-11 ENCOUNTER — Encounter: Payer: Self-pay | Admitting: Internal Medicine

## 2020-12-18 ENCOUNTER — Other Ambulatory Visit: Payer: Self-pay

## 2020-12-18 ENCOUNTER — Ambulatory Visit (AMBULATORY_SURGERY_CENTER): Payer: Self-pay

## 2020-12-18 VITALS — Ht 71.0 in | Wt 172.0 lb

## 2020-12-18 DIAGNOSIS — Z86010 Personal history of colonic polyps: Secondary | ICD-10-CM

## 2020-12-18 MED ORDER — NA SULFATE-K SULFATE-MG SULF 17.5-3.13-1.6 GM/177ML PO SOLN: 1.0000 | Freq: Once | ORAL | 0 refills | Status: AC

## 2020-12-18 NOTE — Progress Notes (Signed)
No egg or soy allergy known to patient  No issues with past sedation with any surgeries or procedures Patient denies ever being told they had issues or difficulty with intubation  No FH of Malignant Hyperthermia No diet pills per patient No home 02 use per patient  No blood thinners per patient  Pt denies issues with constipation  No A fib or A flutter  EMMI video to pt or via [place] 19 guidelines implemented in [place] today with Pt and RN  Pt is fully vaccinated  for [employer] given to pt in PV today , Code to Pharmacy and  NO PA's for preps discussed with pt In PV today  Discussed with pt there will be an out-of-pocket cost for prep and that varies from $0 to 70 dollars   Due to the COVID-19 pandemic we are asking patients to follow certain guidelines.  Pt aware of COVID protocols and [facility] guidelines

## 2020-12-30 DIAGNOSIS — Z23 Encounter for immunization: Secondary | ICD-10-CM | POA: Diagnosis not present

## 2020-12-30 DIAGNOSIS — U071 COVID-19: Secondary | ICD-10-CM | POA: Diagnosis not present

## 2021-01-19 ENCOUNTER — Encounter: Payer: Self-pay | Admitting: Internal Medicine

## 2021-01-19 ENCOUNTER — Other Ambulatory Visit: Payer: Self-pay

## 2021-01-19 ENCOUNTER — Ambulatory Visit (AMBULATORY_SURGERY_CENTER): Payer: Medicare Other | Admitting: Internal Medicine

## 2021-01-19 DIAGNOSIS — K635 Polyp of colon: Secondary | ICD-10-CM | POA: Diagnosis not present

## 2021-01-19 DIAGNOSIS — Z86010 Personal history of colonic polyps: Secondary | ICD-10-CM | POA: Diagnosis not present

## 2021-01-19 DIAGNOSIS — D123 Benign neoplasm of transverse colon: Secondary | ICD-10-CM

## 2021-01-19 MED ORDER — SODIUM CHLORIDE 0.9 % IV SOLN: 500.0000 mL | Freq: Once | INTRAVENOUS | Status: DC

## 2021-01-19 NOTE — Patient Instructions (Signed)
Read all of the handouts given to you by your recover room nurse. ? ?YOU HAD AN ENDOSCOPIC PROCEDURE TODAY AT THE [facility]:   Refer to the procedure report that was given to you for any specific questions about what was found during the examination.  If the procedure report does not answer your questions, please call your gastroenterologist to clarify.  If you requested that your care partner not be given the details of your procedure findings, then the procedure report has been included in a sealed envelope for you to review at your convenience later. ? ?YOU SHOULD EXPECT: Some feelings of bloating in the abdomen. Passage of more gas than usual.  Walking can help get rid of the air that was put into your GI tract during the procedure and reduce the bloating. If you had a lower endoscopy (such as a colonoscopy or flexible sigmoidoscopy) you may notice spotting of blood in your stool or on the toilet paper. If you underwent a bowel prep for your procedure, you may not have a normal bowel movement for a few days. ? ?Please Note:  You might notice some irritation and congestion in your nose or some drainage.  This is from the oxygen used during your procedure.  There is no need for concern and it should clear up in a day or so. ? ?SYMPTOMS TO REPORT IMMEDIATELY: ? ?Following lower endoscopy (colonoscopy or flexible sigmoidoscopy): ? Excessive amounts of blood in the stool ? Significant tenderness or worsening of abdominal pains ? Swelling of the abdomen that is new, acute ? Fever of 100?F or higher ? ? ?For urgent or emergent issues, a gastroenterologist can be reached at any hour by calling [phone]. ?Do not use MyChart messaging for urgent concerns.  ? ? ?DIET:  We do recommend a small meal at first, but then you may proceed to your regular diet.  Drink plenty of fluids but you should avoid alcoholic beverages for 24 hours. ? ?ACTIVITY:  You should plan to take it easy for the rest of today and  you should NOT DRIVE or use heavy machinery until tomorrow (because of the sedation medicines used during the test).   ? ?FOLLOW UP: ?Our staff will call the number listed on your records 48-72 hours following your procedure to check on you and address any questions or concerns that you may have regarding the information given to you following your procedure. If we do not reach you, we will leave a message.  We will attempt to reach you two times.  During this call, we will ask if you have developed any symptoms of COVID 19. If you develop any symptoms (ie: fever, flu-like symptoms, shortness of breath, cough etc.) before then, please call [phone].  If you test positive for Covid 19 in the 2 weeks post procedure, please call and report this information to us.   ? ?If any biopsies were taken you will be contacted by phone or by letter within the next 1-3 weeks.  Please call us at [phone] if you have not heard about the biopsies in 3 weeks.  ? ? ?SIGNATURES/CONFIDENTIALITY: ?You and/or your care partner have signed paperwork which will be entered into your electronic medical record.  These signatures attest to the fact that that the information above on your After Visit Summary has been reviewed and is understood.  Full responsibility of the confidentiality of this discharge information lies with you and/or your care-partner.  ?

## 2021-01-19 NOTE — Op Note (Signed)
[facility] Patient Name: Drew Wyatt Procedure Date: 01/19/2021 9:07 AM MRN: [id number] Endoscopist: Jerene Bears , MD Age: 75 Referring MD:  Date of Birth: 04/14/1946 Gender: Male Account #: [id number] Procedure:                Colonoscopy Indications:              Surveillance: Personal history of adenomatous                            polyps on last colonoscopy 5 years ago Medicines:                Monitored Anesthesia Care Procedure:                Pre-Anesthesia Assessment:                           - Prior to the procedure, a History and Physical                            was performed, and patient medications and                            allergies were reviewed. The patient's tolerance of                            previous anesthesia was also reviewed. The risks                            and benefits of the procedure and the sedation                            options and risks were discussed with the patient.                            All questions were answered, and informed consent                            was obtained. Prior Anticoagulants: The patient has                            taken no previous anticoagulant or antiplatelet                            agents. ASA Grade Assessment: II - A patient with                            mild systemic disease. After reviewing the risks                            and benefits, the patient was deemed in                            satisfactory condition to undergo the procedure.  After obtaining informed consent, the colonoscope                            was passed under direct vision. Throughout the                            procedure, the patient's blood pressure, pulse, and                            oxygen saturations were monitored continuously. The                            Olympus [id number]) Colonoscope was                            introduced through the anus and  advanced to the                            cecum, identified by appendiceal orifice and                            ileocecal valve. The Olympus [id number])                            Colonoscope was introduced through the and advanced                            to the. The colonoscopy was technically difficult                            and complex due to a redundant colon and                            significant looping. Successful completion of the                            procedure was aided by withdrawing the scope and                            replacing with the adult colonoscope and applying                            abdominal pressure. The patient tolerated the                            procedure well. The quality of the bowel                            preparation was fair clearing to good with copious                            irrigation and lavage. The ileocecal valve,  appendiceal orifice, and rectum were photographed. Scope In: 9:17:34 AM Scope Out: 10:05:15 AM Scope Withdrawal Time: 0 hours 13 minutes 21 seconds  Total Procedure Duration: 0 hours 47 minutes 41 seconds  Findings:                 The digital rectal exam was normal.                           Four sessile polyps were found in the transverse                            colon. The polyps were 4 to 9 mm in size.                           Internal hemorrhoids were found during                            retroflexion. The hemorrhoids were small. Complications:            No immediate complications. Estimated Blood Loss:     Estimated blood loss was minimal. Impression:               - Four 4 to 9 mm polyps in the transverse colon,                            removed with a cold snare. Resected and retrieved.                           - Small internal hemorrhoids. Recommendation:           - Patient has a contact number available for                            emergencies. The  signs and symptoms of potential                            delayed complications were discussed with the                            patient. Return to normal activities tomorrow.                            Written discharge instructions were provided to the                            patient.                           - Resume previous diet.                           - Continue present medications.                           - Await pathology results.                           -  Repeat colonoscopy is recommended for                            surveillance. The colonoscopy date will be                            determined after pathology results from today's                            exam become available for review. Jerene Bears, MD 01/19/2021 10:10:41 AM This report has been signed electronically.

## 2021-01-19 NOTE — Progress Notes (Signed)
PT taken to PACU. Monitors in place. VSS. Report given to RN. 

## 2021-01-19 NOTE — Progress Notes (Signed)
Called to room to assist during endoscopic procedure.  Patient ID and intended procedure confirmed with present staff. Received instructions for my participation in the procedure from the performing physician.  

## 2021-01-19 NOTE — Progress Notes (Signed)
VS-CW  Pt's states no medical or surgical changes since previsit or office visit.  

## 2021-01-27 DIAGNOSIS — D224 Melanocytic nevi of scalp and neck: Secondary | ICD-10-CM | POA: Diagnosis not present

## 2021-01-27 DIAGNOSIS — D1801 Hemangioma of skin and subcutaneous tissue: Secondary | ICD-10-CM | POA: Diagnosis not present

## 2021-01-27 DIAGNOSIS — D2272 Melanocytic nevi of left lower limb, including hip: Secondary | ICD-10-CM | POA: Diagnosis not present

## 2021-01-27 DIAGNOSIS — D2262 Melanocytic nevi of left upper limb, including shoulder: Secondary | ICD-10-CM | POA: Diagnosis not present

## 2021-01-27 DIAGNOSIS — M6740 Ganglion, unspecified site: Secondary | ICD-10-CM | POA: Diagnosis not present

## 2021-01-27 DIAGNOSIS — D2271 Melanocytic nevi of right lower limb, including hip: Secondary | ICD-10-CM | POA: Diagnosis not present

## 2021-01-27 DIAGNOSIS — L821 Other seborrheic keratosis: Secondary | ICD-10-CM | POA: Diagnosis not present

## 2021-01-27 DIAGNOSIS — L309 Dermatitis, unspecified: Secondary | ICD-10-CM | POA: Diagnosis not present

## 2021-01-27 DIAGNOSIS — D2261 Melanocytic nevi of right upper limb, including shoulder: Secondary | ICD-10-CM | POA: Diagnosis not present

## 2021-01-30 ENCOUNTER — Encounter: Payer: Self-pay | Admitting: Internal Medicine

## 2021-02-05 DIAGNOSIS — U071 COVID-19: Secondary | ICD-10-CM | POA: Diagnosis not present

## 2021-04-28 DIAGNOSIS — U071 COVID-19: Secondary | ICD-10-CM | POA: Diagnosis not present

## 2021-05-28 DIAGNOSIS — Z20822 Contact with and (suspected) exposure to covid-19: Secondary | ICD-10-CM | POA: Diagnosis not present

## 2021-06-27 DIAGNOSIS — Z20822 Contact with and (suspected) exposure to covid-19: Secondary | ICD-10-CM | POA: Diagnosis not present

## 2021-07-01 DIAGNOSIS — G43109 Migraine with aura, not intractable, without status migrainosus: Secondary | ICD-10-CM | POA: Diagnosis not present

## 2021-07-01 DIAGNOSIS — Z961 Presence of intraocular lens: Secondary | ICD-10-CM | POA: Diagnosis not present

## 2021-07-01 DIAGNOSIS — H04123 Dry eye syndrome of bilateral lacrimal glands: Secondary | ICD-10-CM | POA: Diagnosis not present

## 2021-07-06 DIAGNOSIS — Z23 Encounter for immunization: Secondary | ICD-10-CM | POA: Diagnosis not present

## 2021-07-28 DIAGNOSIS — Z20822 Contact with and (suspected) exposure to covid-19: Secondary | ICD-10-CM | POA: Diagnosis not present

## 2021-08-11 DIAGNOSIS — R7301 Impaired fasting glucose: Secondary | ICD-10-CM | POA: Diagnosis not present

## 2021-08-11 DIAGNOSIS — E559 Vitamin D deficiency, unspecified: Secondary | ICD-10-CM | POA: Diagnosis not present

## 2021-08-11 DIAGNOSIS — R7309 Other abnormal glucose: Secondary | ICD-10-CM | POA: Diagnosis not present

## 2021-08-11 DIAGNOSIS — Z79899 Other long term (current) drug therapy: Secondary | ICD-10-CM | POA: Diagnosis not present

## 2021-08-11 DIAGNOSIS — M1000 Idiopathic gout, unspecified site: Secondary | ICD-10-CM | POA: Diagnosis not present

## 2021-08-11 DIAGNOSIS — E782 Mixed hyperlipidemia: Secondary | ICD-10-CM | POA: Diagnosis not present

## 2021-09-01 DIAGNOSIS — M1000 Idiopathic gout, unspecified site: Secondary | ICD-10-CM | POA: Diagnosis not present

## 2021-09-01 DIAGNOSIS — G250 Essential tremor: Secondary | ICD-10-CM | POA: Diagnosis not present

## 2021-09-01 DIAGNOSIS — R7301 Impaired fasting glucose: Secondary | ICD-10-CM | POA: Diagnosis not present

## 2021-09-01 DIAGNOSIS — Z0000 Encounter for general adult medical examination without abnormal findings: Secondary | ICD-10-CM | POA: Diagnosis not present

## 2021-09-01 DIAGNOSIS — F418 Other specified anxiety disorders: Secondary | ICD-10-CM | POA: Diagnosis not present

## 2021-09-01 DIAGNOSIS — G43109 Migraine with aura, not intractable, without status migrainosus: Secondary | ICD-10-CM | POA: Diagnosis not present

## 2021-09-01 DIAGNOSIS — F419 Anxiety disorder, unspecified: Secondary | ICD-10-CM | POA: Diagnosis not present

## 2021-09-01 DIAGNOSIS — E782 Mixed hyperlipidemia: Secondary | ICD-10-CM | POA: Diagnosis not present

## 2021-09-01 DIAGNOSIS — Z86010 Personal history of colonic polyps: Secondary | ICD-10-CM | POA: Diagnosis not present

## 2021-09-01 DIAGNOSIS — N529 Male erectile dysfunction, unspecified: Secondary | ICD-10-CM | POA: Diagnosis not present

## 2021-09-09 DIAGNOSIS — Z23 Encounter for immunization: Secondary | ICD-10-CM | POA: Diagnosis not present

## 2022-02-08 ENCOUNTER — Telehealth: Payer: Self-pay | Admitting: Internal Medicine

## 2022-02-08 NOTE — Telephone Encounter (Signed)
Patient responded to you on My Chart.  Please call patient.  Thank you. ?

## 2022-02-08 NOTE — Telephone Encounter (Signed)
We received a call from patient. Has a scheduled OV 03/01/22 for abd discomfort. Per patient, requesting to be seen sooner due to protruding hernia. Patient states he is currently out of the country and will return late May. Is there anything we can do? (Patient said to send message on mychart as he is out of the country) Please advise.  ?

## 2022-02-08 NOTE — Telephone Encounter (Signed)
Sent message to pt via mychart

## 2022-02-08 NOTE — Telephone Encounter (Signed)
Pts appt moved to 02/23/22 at 1:30pm with Vicie Mutters PA. Pt notified via mychart. ?

## 2022-02-15 NOTE — Progress Notes (Signed)
02/23/2022 Drew Wyatt [id number] 1946/09/09  Referring provider: Cari Caraway, MD Primary GI doctor: Dr. Hilarie Fredrickson  ASSESSMENT AND PLAN:   Chronic idiopathic constipation - Increase fiber/ water intake, decrease caffeine, increase activity level. -Will add on Miralax daily and Benefiber - Please go to the hospital if you have severe abdominal pain, vomiting, fever, CP, SOB.   Left inguinal hernia -     US Abdomen Limited; Future -     Ambulatory referral to General Surgery Discussed hernia options Will get limited AB [place]  Since having some discomfort will refer to gen surgeon If any symptoms of strangulation will go to ER Add miralax don't lift heavy objects, continue with compression    History of Present Illness:  76 y.o. male  with a past medical history of hyperlipidemia, adenomatous polyps, anxiety and others listed below, returns to clinic today for evaluation of abdominal pain.  01/19/2021 colonoscopy Dr. Hilarie Fredrickson for personal history of adenomatous polyps last colonoscopy 5 years ago.  4 sessile serrated polyps transverse colon measuring 4-9 mm in size, internal hemorrhoids small-recall 3 years 04/15/2016 endoscopy with Dr. Amedeo Plenty at [facility] small hiatal hernia, gastritis, normal duodenum.  Just got [place] from [place].  States he has history of moving bowels every 3-4 days, it is impacted/very hard stools.  Patient states 4-6 weeks ago he was straining and felt literal pop without pain, then felt like he pulled muscle left inguinal area and feels a bulge with standing.  Started to take laxative every 3-4 days and would have BM next day, did not want to strain if was hernia.  Patient bought a truss that helps significantly.  Denies nausea, vomiting, no fever, chills.  Going to graduation trip and then doing [place] trip for a week.  No blood in the stool.   Current Medications:     Current Outpatient Medications (Cardiovascular):    atorvastatin  (LIPITOR) 40 MG tablet, Take 40 mg by mouth every evening.   sildenafil (VIAGRA) 50 MG tablet, Take 50 mg by mouth daily as needed. Erectile dysfunction     Current Outpatient Medications (Analgesics):    allopurinol (ZYLOPRIM) 300 MG tablet, Take 300 mg by mouth daily.   aspirin EC 81 MG tablet, Take 81 mg by mouth daily. Pt takes every other day   COLCRYS 0.6 MG tablet, Take 0.6 mg by mouth daily as needed. For gout flare     Current Outpatient Medications (Other):    cholecalciferol (VITAMIN D) 1000 UNITS tablet, Take 1,000 Units by mouth daily.   MELATONIN PO, Take 3 mg by mouth at bedtime.    sertraline (ZOLOFT) 100 MG tablet, Take 100 mg by mouth at bedtime.  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 Family History:  His family history includes Breast cancer in his mother; Lung cancer in his father. Social History:   reports that he has quit smoking. He has never used smokeless tobacco. He reports current alcohol use of about 4.0 standard drinks per week. He reports that he does not use drugs.  Current  Medications, Allergies, Past Medical History, Past Surgical History, Family History and Social History were reviewed in [employer] record.  Physical Exam: BP 138/82   Pulse (!) 57   Ht '5\' 11"'$  (1.803 m)   Wt 174 lb (78.9 kg)   SpO2 98%   BMI 24.27 kg/m  General:   Pleasant, well developed male in no acute distress Heart :  Regular rate and rhythm; no murmurs Pulm: Clear anteriorly; no wheezing Abdomen:  Soft, Obese AB, Active bowel sounds. mild tenderness in the LLQ. Without guarding and Without rebound, No organomegaly appreciated. Patient does have left lower proximal inguinal bulge while standing, easily reducible, slightly tender.  Rectal: Not evaluated Extremities:  without  edema. Neurologic:  Alert and  oriented x4;  No focal deficits.  Psych:  Cooperative. Normal mood and affect.   Vladimir Crofts, PA-C 02/23/22

## 2022-02-23 ENCOUNTER — Encounter: Payer: Self-pay | Admitting: Physician Assistant

## 2022-02-23 ENCOUNTER — Ambulatory Visit (INDEPENDENT_AMBULATORY_CARE_PROVIDER_SITE_OTHER): Payer: Medicare Other | Admitting: Physician Assistant

## 2022-02-23 VITALS — BP 138/82 | HR 57 | Ht 71.0 in | Wt 174.0 lb

## 2022-02-23 DIAGNOSIS — K5904 Chronic idiopathic constipation: Secondary | ICD-10-CM

## 2022-02-23 DIAGNOSIS — K4090 Unilateral inguinal hernia, without obstruction or gangrene, not specified as recurrent: Secondary | ICD-10-CM

## 2022-02-23 NOTE — Patient Instructions (Signed)
If you are age 77 or older, your body mass index should be between 23-30. Your Body mass index is 24.27 kg/m. If this is out of the aforementioned range listed, please consider follow up with your Primary Care Provider. ________________________________________________________  The [facility] providers would like to encourage you to use [facility] to communicate with providers for non-urgent requests or questions.  Due to long hold times on the telephone, sending your provider a message by [facility] may be a faster and more efficient way to get a response.  Please allow 48 business hours for a response.  Please remember that this is for non-urgent requests.  _______________________________________________________  Drew Wyatt have been scheduled for an abdominal ultrasound at [facility]iology (1st floor of hospital) on 03/02/2022 at 11:00 am. Please arrive 15 minutes prior to your appointment for registration. Make certain not to have anything to eat or drink 6 hours prior to your appointment. Should you need to reschedule your appointment, please contact radiology at [phone]. This test typically takes about 30 minutes to perform.  We have referred your [facility]. Please allow at least 2 weeks for them to contact you. They are located at [address] [place], [place] Phone: [phone]  Follow up pending your [place] or as needed.  Thank you for entrusting me with your care and choosing [facility].  Vicie Mutters, PA-C  Inguinal Hernia, Adult An inguinal hernia develops when fat or the intestines push through a weak spot in a muscle where the leg meets the lower abdomen (groin). This creates a bulge. This kind of hernia could also be: In the scrotum, if you are male. In folds of skin around the vagina, if you are male. There are three types of inguinal hernias: Hernias that can be pushed back into the abdomen (are reducible). This type rarely causes  pain. Hernias that are not reducible (are incarcerated). Hernias that are not reducible and lose their blood supply (are strangulated). This type of hernia requires emergency surgery. What are the causes? This condition is caused by having a weak spot in the muscles or tissues in your groin. This develops over time. The hernia may poke through the weak spot when you suddenly strain your lower abdominal muscles, such as when you: Lift a heavy object. Strain to have a bowel movement. Constipation can lead to straining. Cough. What increases the risk? This condition is more likely to develop in: Males. Pregnant females. People who: Are overweight. Work in jobs that require long periods of standing or heavy lifting. Have had an inguinal hernia before. Smoke or have lung disease. These factors can lead to long-term (chronic) coughing. What are the signs or symptoms? Symptoms may depend on the size of the hernia. Often, a small inguinal hernia has no symptoms. Symptoms of a larger hernia may include: A bulge in the groin area. This is easier to see when standing. It might not be visible when lying down. Pain or burning in the groin. This may get worse when lifting, straining, or coughing. A dull ache or a feeling of pressure in the groin. An unusual bulge in the scrotum, in males. Symptoms of a strangulated inguinal hernia may include: A bulge in your groin that is very painful and tender to the touch. A bulge that turns red or purple. Fever, nausea, and vomiting. Inability to have a bowel movement or to pass gas. How is this diagnosed? This condition is diagnosed based on your symptoms, your  medical history, and a physical exam. Your health care provider may feel your groin area and ask you to cough. How is this treated? Treatment depends on the size of your hernia and whether you have symptoms. If you do not have symptoms, your health care provider may have you watch your hernia carefully  and have you come in for follow-up visits. If your hernia is large or if you have symptoms, you may need surgery to repair the hernia. Follow these instructions at home: Lifestyle Avoid lifting heavy objects. Avoid standing for long periods of time. Do not use any products that contain nicotine or tobacco. These products include cigarettes, chewing tobacco, and vaping devices, such as e-cigarettes. If you need help quitting, ask your health care provider. Maintain a healthy weight. Preventing constipation You may need to take these actions to prevent or treat constipation: Drink enough fluid to keep your urine pale yellow. Take over-the-counter or prescription medicines. Eat foods that are high in fiber, such as beans, whole grains, and fresh fruits and vegetables. Limit foods that are high in fat and processed sugars, such as fried or sweet foods. General instructions You may try to push the hernia back in place by very gently pressing on it while lying down. Do not try to force the bulge back in if it will not push in easily. Watch your hernia for any changes in shape, size, or color. Get help right away if you notice any changes. Take over-the-counter and prescription medicines only as told by your health care provider. Keep all follow-up visits. This is important. Contact a health care provider if: You have a fever or chills. You develop new symptoms. Your symptoms get worse. Get help right away if: You have pain in your groin that suddenly gets worse. You have a bulge in your groin that: Suddenly gets bigger and does not get smaller. Becomes red or purple or painful to the touch. You are a man and you have a sudden pain in your scrotum, or the size of your scrotum suddenly changes. You cannot push the hernia back in place by very gently pressing on it when you are lying down. You have nausea or vomiting that does not go away. You have a fast heartbeat. You cannot have a bowel  movement or pass gas. These symptoms may represent a serious problem that is an emergency. Do not wait to see if the symptoms will go away. Get medical help right away. Call your local emergency services (911 in the [place] Summary An inguinal hernia develops when fat or the intestines push through a weak spot in a muscle where your leg meets your lower abdomen (groin). This condition is caused by having a weak spot in muscles or tissues in your groin. Symptoms may depend on the size of the hernia, and they may include pain or swelling in your groin. A small inguinal hernia often has no symptoms. Treatment may not be needed if you do not have symptoms. If you have symptoms or a large hernia, you may need surgery to repair the hernia. Avoid lifting heavy objects. Also, avoid standing for long periods of time. This information is not intended to replace advice given to you by your health care provider. Make sure you discuss any questions you have with your health care provider. Document Revised: 05/13/2020 Document Reviewed: 05/13/2020 Elsevier Patient Education  Dewar is an osmotic laxative.  It only brings more water into the stool.  This  is safe to take daily.  Can take up to 17 gram of miralax twice a day.  Mix with juice or coffee.  Start 1 capful at night for 3-4 days and reassess your response in 3-4 days.  You can increase and decrease the dose based on your response.  Remember, it can take up to 3-4 days to take effect OR for the effects to wear off.   I often pair this with benefiber in the morning to help assure the stool is not too loose.   Get a squatty potty to use at home or try a stool, goal is to get your knees above your hips during a bowel movement.  - Drink at least 64-80 ounces of water/liquid per day. - Establish a time to try to move your bowels every day.  For many people, this is after a cup of coffee or after a meal such as breakfast. - Sit all  of the way back on the toilet keeping your back fairly straight and while sitting up, try to rest the tops of your forearms on your upper thighs.   - Raising your feet with a step stool/squatty potty can be helpful to improve the angle that allows your stool to pass through the rectum. - Relax the rectum feeling it bulge toward the toilet water.  If you feel your rectum raising toward your body, you are contracting rather than relaxing. - Breathe in and slowly exhale. "Belly breath" by expanding your belly towards your belly button. Keep belly expanded as you gently direct pressure down and back to the anus.  A low pitched GRRR sound can assist with increasing intra-abdominal pressure.  - Repeat 3-4 times. If unsuccessful, contract the pelvic floor to restore normal tone and get off the toilet.  Avoid excessive straining. - To reduce excessive wiping by teaching your anus to normally contract, place hands on outer aspect of knees and resist knee movement outward.  Hold 5-10 second then place hands just inside of knees and resist inward movement of knees.  Hold 5 seconds.  Repeat a few times each way.  Go to the ER if unable to pass gas, severe AB pain, unable to hold down food, any shortness of breath of chest pain.   Constipation, Adult Constipation is when a person has fewer than three bowel movements in a week, has difficulty having a bowel movement, or has stools (feces) that are dry, hard, or larger than normal. Constipation may be caused by an underlying condition. It may become worse with age if a person takes certain medicines and does not take in enough fluids. Follow these instructions at home: Eating and drinking  Eat foods that have a lot of fiber, such as beans, whole grains, and fresh fruits and vegetables. Limit foods that are low in fiber and high in fat and processed sugars, such as fried or sweet foods. These include french fries, hamburgers, cookies, candies, and soda. Drink enough  fluid to keep your urine pale yellow. General instructions Exercise regularly or as told by your health care provider. Try to do 150 minutes of moderate exercise each week. Use the bathroom when you have the urge to go. Do not hold it in. Take over-the-counter and prescription medicines only as told by your health care provider. This includes any fiber supplements. During bowel movements: Practice deep breathing while relaxing the lower abdomen. Practice pelvic floor relaxation. Watch your condition for any changes. Let your health care provider know about them.  Keep all follow-up visits as told by your health care provider. This is important. Contact a health care provider if: You have pain that gets worse. You have a fever. You do not have a bowel movement after 4 days. You vomit. You are not hungry or you lose weight. You are bleeding from the opening between the buttocks (anus). You have thin, pencil-like stools. Get help right away if: You have a fever and your symptoms suddenly get worse. You leak stool or have blood in your stool. Your abdomen is bloated. You have severe pain in your abdomen. You feel dizzy or you faint. Summary Constipation is when a person has fewer than three bowel movements in a week, has difficulty having a bowel movement, or has stools (feces) that are dry, hard, or larger than normal. Eat foods that have a lot of fiber, such as beans, whole grains, and fresh fruits and vegetables. Drink enough fluid to keep your urine pale yellow. Take over-the-counter and prescription medicines only as told by your health care provider. This includes any fiber supplements. This information is not intended to replace advice given to you by your health care provider. Make sure you discuss any questions you have with your health care provider. Document Revised: 08/01/2019 Document Reviewed: 08/01/2019 Elsevier Patient Education  [place].

## 2022-02-25 DIAGNOSIS — Z23 Encounter for immunization: Secondary | ICD-10-CM | POA: Diagnosis not present

## 2022-03-01 ENCOUNTER — Ambulatory Visit: Payer: Medicare Other | Admitting: Physician Assistant

## 2022-03-02 ENCOUNTER — Other Ambulatory Visit: Payer: Self-pay

## 2022-03-02 ENCOUNTER — Telehealth: Payer: Self-pay | Admitting: Physician Assistant

## 2022-03-02 ENCOUNTER — Ambulatory Visit (HOSPITAL_COMMUNITY)
Admission: RE | Admit: 2022-03-02 | Discharge: 2022-03-02 | Disposition: A | Discharge status: Home / Self Care | Payer: Medicare Other | Source: Ambulatory Visit | Attending: Physician Assistant | Admitting: Physician Assistant

## 2022-03-02 ENCOUNTER — Other Ambulatory Visit: Payer: Self-pay | Admitting: Physician Assistant

## 2022-03-02 DIAGNOSIS — K4090 Unilateral inguinal hernia, without obstruction or gangrene, not specified as recurrent: Secondary | ICD-10-CM | POA: Insufficient documentation

## 2022-03-02 NOTE — Telephone Encounter (Signed)
CT orders have been placed. Schedulers notified. Patient is aware & will call back next week if he has not heard from [facility].

## 2022-03-02 NOTE — Telephone Encounter (Signed)
Inbound call from patient stating that he needed to schedule an appointment to have a CT but was not sure who to call or what to do. Please advise.

## 2022-03-05 NOTE — Progress Notes (Signed)
Addendum: Reviewed and agree with assessment and management plan. Deakin Lacek M, MD  

## 2022-03-09 ENCOUNTER — Telehealth: Payer: Self-pay | Admitting: Physician Assistant

## 2022-03-09 NOTE — Telephone Encounter (Signed)
Patient called saying he still has not heard from [facility].  Please call and advise.  Thank you.

## 2022-03-09 NOTE — Telephone Encounter (Signed)
I spoke with [facility] and they are calling him today to set up the appt.

## 2022-03-25 ENCOUNTER — Ambulatory Visit (HOSPITAL_COMMUNITY)
Admission: RE | Admit: 2022-03-25 | Discharge: 2022-03-25 | Disposition: A | Discharge status: Home / Self Care | Payer: Medicare Other | Source: Ambulatory Visit | Attending: Physician Assistant | Admitting: Physician Assistant

## 2022-03-25 DIAGNOSIS — K4090 Unilateral inguinal hernia, without obstruction or gangrene, not specified as recurrent: Secondary | ICD-10-CM | POA: Insufficient documentation

## 2022-03-25 MED ORDER — SODIUM CHLORIDE (PF) 0.9 % IJ SOLN
INTRAMUSCULAR | Status: AC
  Filled 2022-03-25: qty 50

## 2022-03-25 MED ORDER — IOHEXOL 300 MG/ML  SOLN
100.0000 mL | Freq: Once | INTRAMUSCULAR | Status: AC | PRN
  Administered 2022-03-25: 100 mL via INTRAVENOUS

## 2022-03-29 ENCOUNTER — Ambulatory Visit: Payer: Self-pay | Admitting: Surgery

## 2022-03-29 DIAGNOSIS — R161 Splenomegaly, not elsewhere classified: Secondary | ICD-10-CM | POA: Diagnosis not present

## 2022-03-29 DIAGNOSIS — K429 Umbilical hernia without obstruction or gangrene: Secondary | ICD-10-CM | POA: Diagnosis not present

## 2022-03-29 DIAGNOSIS — K4090 Unilateral inguinal hernia, without obstruction or gangrene, not specified as recurrent: Secondary | ICD-10-CM | POA: Diagnosis not present

## 2022-04-01 NOTE — Progress Notes (Addendum)
For Short Stay: [place] appointment date: N/A Date of COVID positive in last 51 days:N/A  Bowel Prep reminder: N/A   For Anesthesia: PCP - Cari Caraway, MD Cardiologist - Sherren Mocha, MD   Chest x-ray - N/A EKG - greater than 1 year in epic Stress Test - greater than 2 years in epic ECHO - N/A Cardiac Cath - N/A Pacemaker/ICD device last checked:N/A Pacemaker orders received: N/A Device Rep notified: N/A  Spinal Cord Stimulator:N/A  Sleep Study - N/A CPAP - N/A  Fasting Blood Sugar - N/A Checks Blood Sugar __N/A___ times a day Date and result of last Hgb A1c-N/A  Blood Thinner Instructions: N/A Aspirin Instructions: Per patient Dr. Johney [place] office did not require him to stop aspirin. Last Dose:  Activity level: Able to exercise without chest pain and/or shortness of breath    Anesthesia review: N/A  Patient denies shortness of breath, fever, cough and chest pain at PAT appointment   Patient verbalized understanding of instructions that were given to them at the PAT appointment. Patient was also instructed that they will need to review over the PAT instructions again at home before surgery.

## 2022-04-02 ENCOUNTER — Other Ambulatory Visit: Payer: Self-pay

## 2022-04-02 ENCOUNTER — Encounter (HOSPITAL_COMMUNITY): Payer: Self-pay | Admitting: Surgery

## 2022-04-07 ENCOUNTER — Encounter (HOSPITAL_COMMUNITY)
Admission: RE | Disposition: A | Discharge status: Home / Self Care | Payer: Self-pay | Source: Home / Self Care | Attending: Surgery

## 2022-04-07 ENCOUNTER — Encounter (HOSPITAL_COMMUNITY): Payer: Self-pay | Admitting: Surgery

## 2022-04-07 ENCOUNTER — Ambulatory Visit (HOSPITAL_BASED_OUTPATIENT_CLINIC_OR_DEPARTMENT_OTHER): Payer: Medicare Other | Admitting: Anesthesiology

## 2022-04-07 ENCOUNTER — Ambulatory Visit (HOSPITAL_COMMUNITY)
Admission: RE | Admit: 2022-04-07 | Discharge: 2022-04-07 | Disposition: A | Discharge status: Home / Self Care | Payer: Medicare Other | Attending: Surgery | Admitting: Surgery

## 2022-04-07 ENCOUNTER — Ambulatory Visit (HOSPITAL_COMMUNITY): Payer: Medicare Other | Admitting: Anesthesiology

## 2022-04-07 DIAGNOSIS — Z87891 Personal history of nicotine dependence: Secondary | ICD-10-CM | POA: Diagnosis not present

## 2022-04-07 DIAGNOSIS — K429 Umbilical hernia without obstruction or gangrene: Secondary | ICD-10-CM | POA: Diagnosis not present

## 2022-04-07 DIAGNOSIS — K5900 Constipation, unspecified: Secondary | ICD-10-CM | POA: Diagnosis not present

## 2022-04-07 DIAGNOSIS — K4020 Bilateral inguinal hernia, without obstruction or gangrene, not specified as recurrent: Secondary | ICD-10-CM

## 2022-04-07 HISTORY — DX: Unspecified osteoarthritis, unspecified site: M19.90

## 2022-04-07 HISTORY — DX: Personal history of other diseases of the digestive system: Z87.19

## 2022-04-07 HISTORY — PX: UMBILICAL HERNIA REPAIR: SHX196

## 2022-04-07 HISTORY — PX: INGUINAL HERNIA REPAIR: SHX194

## 2022-04-07 HISTORY — DX: Gout, unspecified: M10.9

## 2022-04-07 HISTORY — DX: Cardiac murmur, unspecified: R01.1

## 2022-04-07 HISTORY — DX: Migraine with aura, not intractable, without status migrainosus: G43.109

## 2022-04-07 SURGERY — REPAIR, HERNIA, INGUINAL, LAPAROSCOPIC
Anesthesia: General | Site: Abdomen

## 2022-04-07 MED ORDER — 0.9 % SODIUM CHLORIDE (POUR BTL) OPTIME
TOPICAL | Status: DC | PRN
  Administered 2022-04-07: 1000 mL

## 2022-04-07 MED ORDER — FENTANYL CITRATE PF 50 MCG/ML IJ SOSY: 25.0000 ug | PREFILLED_SYRINGE | INTRAMUSCULAR | Status: DC | PRN

## 2022-04-07 MED ORDER — ROCURONIUM BROMIDE 10 MG/ML (PF) SYRINGE
PREFILLED_SYRINGE | INTRAVENOUS | Status: DC | PRN
  Administered 2022-04-07: 70 mg via INTRAVENOUS

## 2022-04-07 MED ORDER — SODIUM CHLORIDE 0.9% FLUSH: 3.0000 mL | Freq: Two times a day (BID) | INTRAVENOUS | Status: DC

## 2022-04-07 MED ORDER — CEFAZOLIN SODIUM-DEXTROSE 2-4 GM/100ML-% IV SOLN
2.0000 g | INTRAVENOUS | Status: AC
  Administered 2022-04-07: 2 g via INTRAVENOUS
  Filled 2022-04-07: qty 100

## 2022-04-07 MED ORDER — BUPIVACAINE LIPOSOME 1.3 % IJ SUSP: 20.0000 mL | Freq: Once | INTRAMUSCULAR | Status: DC

## 2022-04-07 MED ORDER — SODIUM CHLORIDE 0.9% FLUSH: 3.0000 mL | INTRAVENOUS | Status: DC | PRN

## 2022-04-07 MED ORDER — MIDAZOLAM HCL 2 MG/2ML IJ SOLN
INTRAMUSCULAR | Status: AC
  Filled 2022-04-07: qty 2

## 2022-04-07 MED ORDER — ONDANSETRON HCL 4 MG/2ML IJ SOLN: 4.0000 mg | Freq: Once | INTRAMUSCULAR | Status: DC | PRN

## 2022-04-07 MED ORDER — LACTATED RINGERS IR SOLN
Status: DC | PRN
  Administered 2022-04-07: 1000 mL

## 2022-04-07 MED ORDER — FENTANYL CITRATE (PF) 100 MCG/2ML IJ SOLN
INTRAMUSCULAR | Status: DC | PRN
  Administered 2022-04-07 (×3): 50 ug via INTRAVENOUS

## 2022-04-07 MED ORDER — BUPIVACAINE-EPINEPHRINE (PF) 0.25% -1:200000 IJ SOLN
INTRAMUSCULAR | Status: AC
  Filled 2022-04-07: qty 30

## 2022-04-07 MED ORDER — BUPIVACAINE LIPOSOME 1.3 % IJ SUSP
INTRAMUSCULAR | Status: DC | PRN
  Administered 2022-04-07: 20 mL

## 2022-04-07 MED ORDER — BUPIVACAINE-EPINEPHRINE 0.25% -1:200000 IJ SOLN
INTRAMUSCULAR | Status: DC | PRN
  Administered 2022-04-07: 60 mL

## 2022-04-07 MED ORDER — TRAMADOL HCL 50 MG PO TABS: 50.0000 mg | ORAL_TABLET | Freq: Four times a day (QID) | ORAL | 0 refills | Status: DC | PRN

## 2022-04-07 MED ORDER — ENSURE PRE-SURGERY PO LIQD
296.0000 mL | Freq: Once | ORAL | Status: DC
  Filled 2022-04-07: qty 296

## 2022-04-07 MED ORDER — PROPOFOL 10 MG/ML IV BOLUS
INTRAVENOUS | Status: DC | PRN
  Administered 2022-04-07: 150 mg via INTRAVENOUS

## 2022-04-07 MED ORDER — ONDANSETRON HCL 4 MG/2ML IJ SOLN
INTRAMUSCULAR | Status: AC
  Filled 2022-04-07: qty 2

## 2022-04-07 MED ORDER — DEXAMETHASONE SODIUM PHOSPHATE 10 MG/ML IJ SOLN
INTRAMUSCULAR | Status: DC | PRN
  Administered 2022-04-07: 4 mg via INTRAVENOUS

## 2022-04-07 MED ORDER — SODIUM CHLORIDE 0.9 % IV SOLN: 250.0000 mL | INTRAVENOUS | Status: DC | PRN

## 2022-04-07 MED ORDER — CHLORHEXIDINE GLUCONATE CLOTH 2 % EX PADS: 6.0000 | MEDICATED_PAD | Freq: Once | CUTANEOUS | Status: DC

## 2022-04-07 MED ORDER — DEXAMETHASONE SODIUM PHOSPHATE 10 MG/ML IJ SOLN
INTRAMUSCULAR | Status: AC
Start: 2022-04-07 — End: ?
  Filled 2022-04-07: qty 1

## 2022-04-07 MED ORDER — GABAPENTIN 300 MG PO CAPS
300.0000 mg | ORAL_CAPSULE | ORAL | Status: DC
  Filled 2022-04-07: qty 1

## 2022-04-07 MED ORDER — BUPIVACAINE LIPOSOME 1.3 % IJ SUSP
INTRAMUSCULAR | Status: AC
  Filled 2022-04-07: qty 20

## 2022-04-07 MED ORDER — ACETAMINOPHEN 500 MG PO TABS
1000.0000 mg | ORAL_TABLET | ORAL | Status: AC
  Administered 2022-04-07: 1000 mg via ORAL
  Filled 2022-04-07: qty 2

## 2022-04-07 MED ORDER — LIDOCAINE 2% (20 MG/ML) 5 ML SYRINGE
INTRAMUSCULAR | Status: DC | PRN
  Administered 2022-04-07: 100 mg via INTRAVENOUS

## 2022-04-07 MED ORDER — KETOROLAC TROMETHAMINE 30 MG/ML IJ SOLN
INTRAMUSCULAR | Status: AC
  Filled 2022-04-07: qty 1

## 2022-04-07 MED ORDER — CHLORHEXIDINE GLUCONATE 0.12 % MT SOLN
15.0000 mL | Freq: Once | OROMUCOSAL | Status: AC
  Administered 2022-04-07: 15 mL via OROMUCOSAL

## 2022-04-07 MED ORDER — KETOROLAC TROMETHAMINE 30 MG/ML IJ SOLN
15.0000 mg | Freq: Once | INTRAMUSCULAR | Status: AC | PRN
Start: 2022-04-07 — End: 2022-04-07
  Administered 2022-04-07: 15 mg via INTRAVENOUS

## 2022-04-07 MED ORDER — LIDOCAINE HCL (PF) 2 % IJ SOLN
INTRAMUSCULAR | Status: AC
  Filled 2022-04-07: qty 5

## 2022-04-07 MED ORDER — ROCURONIUM BROMIDE 10 MG/ML (PF) SYRINGE
PREFILLED_SYRINGE | INTRAVENOUS | Status: AC
  Filled 2022-04-07: qty 10

## 2022-04-07 MED ORDER — PROPOFOL 10 MG/ML IV BOLUS
INTRAVENOUS | Status: AC
  Filled 2022-04-07: qty 20

## 2022-04-07 MED ORDER — LACTATED RINGERS IV SOLN: INTRAVENOUS | Status: DC

## 2022-04-07 MED ORDER — FENTANYL CITRATE (PF) 250 MCG/5ML IJ SOLN
INTRAMUSCULAR | Status: AC
  Filled 2022-04-07: qty 5

## 2022-04-07 MED ORDER — LACTATED RINGERS IV BOLUS
500.0000 mL | Freq: Once | INTRAVENOUS | Status: AC
Start: 2022-04-07 — End: 2022-04-07
  Administered 2022-04-07: 500 mL via INTRAVENOUS

## 2022-04-07 MED ORDER — ONDANSETRON HCL 4 MG/2ML IJ SOLN
INTRAMUSCULAR | Status: DC | PRN
  Administered 2022-04-07: 4 mg via INTRAVENOUS

## 2022-04-07 MED ORDER — ORAL CARE MOUTH RINSE: 15.0000 mL | Freq: Once | OROMUCOSAL | Status: AC

## 2022-04-07 SURGICAL SUPPLY — 39 items
BAG COUNTER SPONGE SURGICOUNT (BAG) IMPLANT
CABLE HIGH FREQUENCY MONO STRZ (ELECTRODE) ×3 IMPLANT
CHLORAPREP W/TINT 26 (MISCELLANEOUS) ×3 IMPLANT
COVER SURGICAL LIGHT HANDLE (MISCELLANEOUS) ×3 IMPLANT
DEVICE SECURE STRAP 25 ABSORB (INSTRUMENTS) IMPLANT
DRAPE WARM FLUID 44X44 (DRAPES) ×3 IMPLANT
DRSG TEGADERM 2-3/8X2-3/4 SM (GAUZE/BANDAGES/DRESSINGS) ×4 IMPLANT
DRSG TEGADERM 4X4.75 (GAUZE/BANDAGES/DRESSINGS) ×2 IMPLANT
ELECT REM PT RETURN 15FT ADLT (MISCELLANEOUS) ×3 IMPLANT
GAUZE SPONGE 2X2 8PLY STRL LF (GAUZE/BANDAGES/DRESSINGS) ×2 IMPLANT
GLOVE ECLIPSE 8.0 STRL XLNG CF (GLOVE) ×3 IMPLANT
GLOVE INDICATOR 8.0 STRL GRN (GLOVE) ×3 IMPLANT
GOWN STRL REUS W/ TWL XL LVL3 (GOWN DISPOSABLE) ×6 IMPLANT
GOWN STRL REUS W/TWL XL LVL3 (GOWN DISPOSABLE) ×9
IRRIG SUCT STRYKERFLOW 2 WTIP (MISCELLANEOUS) ×3
IRRIGATION SUCT STRKRFLW 2 WTP (MISCELLANEOUS) IMPLANT
KIT BASIN OR (CUSTOM PROCEDURE TRAY) ×3 IMPLANT
KIT TURNOVER KIT A (KITS) ×1 IMPLANT
MARKER SKIN DUAL TIP RULER LAB (MISCELLANEOUS) ×3 IMPLANT
MESH BARD SOFT 6X6IN (Mesh General) ×3 IMPLANT
NDL INSUFFLATION 14GA 120MM (NEEDLE) IMPLANT
NEEDLE INSUFFLATION 14GA 120MM (NEEDLE) ×3 IMPLANT
PAD POSITIONING PINK XL (MISCELLANEOUS) ×3 IMPLANT
SCISSORS LAP 5X35 DISP (ENDOMECHANICALS) ×3 IMPLANT
SET TUBE SMOKE EVAC HIGH FLOW (TUBING) ×3 IMPLANT
SLEEVE ADV FIXATION 5X100MM (TROCAR) ×3 IMPLANT
SPIKE FLUID TRANSFER (MISCELLANEOUS) ×3 IMPLANT
SPONGE GAUZE 2X2 STER 10/PKG (GAUZE/BANDAGES/DRESSINGS)
SUT MNCRL AB 4-0 PS2 18 (SUTURE) ×3 IMPLANT
SUT PDS AB 1 CT1 27 (SUTURE) ×6 IMPLANT
SUT VIC AB 2-0 SH 27 (SUTURE)
SUT VIC AB 2-0 SH 27X BRD (SUTURE) IMPLANT
SUT VICRYL 0 UR6 27IN ABS (SUTURE) ×3 IMPLANT
TACKER 5MM HERNIA 3.5CML NAB (ENDOMECHANICALS) IMPLANT
TOWEL OR 17X26 10 PK STRL BLUE (TOWEL DISPOSABLE) ×3 IMPLANT
TOWEL OR NON WOVEN STRL DISP B (DISPOSABLE) ×3 IMPLANT
TRAY LAPAROSCOPIC (CUSTOM PROCEDURE TRAY) ×3 IMPLANT
TROCAR ADV FIXATION 5X100MM (TROCAR) ×3 IMPLANT
TROCAR BALLN 12MMX100 BLUNT (TROCAR) ×3 IMPLANT

## 2022-04-07 NOTE — Anesthesia Procedure Notes (Signed)
Procedure Name: Intubation Date/Time: 04/07/2022 3:32 PM  Performed by: Niel Hummer, CRNAPre-anesthesia Checklist: Emergency Drugs available, Patient identified, Suction available and Patient being monitored Patient Re-evaluated:Patient Re-evaluated prior to induction Oxygen Delivery Method: Circle system utilized Preoxygenation: Pre-oxygenation with 100% oxygen Induction Type: IV induction Ventilation: Oral airway inserted - appropriate to patient size and Mask ventilation without difficulty Laryngoscope Size: Mac and 4 Grade View: Grade I Tube type: Oral Tube size: 7.5 mm Number of attempts: 1 Airway Equipment and Method: Stylet Placement Confirmation: ETT inserted through vocal cords under direct vision, positive ETCO2 and breath sounds checked- equal and bilateral Secured at: 23 cm Tube secured with: Tape Dental Injury: Teeth and Oropharynx as per pre-operative assessment

## 2022-04-07 NOTE — Op Note (Signed)
04/07/2022  5:44 PM  PATIENT:  Drew Wyatt  76 y.o. male  Patient Care Team: Cari Caraway, MD as PCP - General (Family Medicine) Michael Boston, MD as Consulting Physician (General Surgery) Pyrtle, Lajuan Lines, MD as Consulting Physician (Gastroenterology)  PRE-OPERATIVE DIAGNOSIS:  LEFT INGUINAL HERNIA, UMBILICAL HERNIA POSSIBLE RIGHT HERNIA  POST-OPERATIVE DIAGNOSIS:   BILATERAL INGUINAL HERNIAE UMBILICAL HERNIA   PROCEDURE:   LAPAROSCOPIC BILATERAL INGUINAL HERNIA REPAIR WITH MESH PRIMARY UMBILICAL HERNIA REPAIR TRANSVERSUS ABDOMINIS PLANE (TAP) BLOCK - BILATERAL  SURGEON:  Adin Hector, MD  ASSISTANT: Braulio Bosch, MD, [employer]  ANESTHESIA:     Regional ilioinguinal and genitofemoral and spermatic cord nerve blocks  General  Regional TRANSVERSUS ABDOMINIS PLANE (TAP) nerve block for perioperative & postoperative pain control provided with liposomal bupivacaine (Experel) mixed with 0.25% bupivacaine as a Bilateral TAP block x 42m each side at the level of the transverse abdominis & preperitoneal spaces along the flank at the anterior axillary line, from subcostal ridge to iliac crest under laparoscopic guidance    EBL:  Total I/O In: 100 [IV Piggyback:100] Out: 50 [Blood:50].  See anesthesia record  Delay start of Pharmacological VTE agent (>24hrs) due to surgical blood loss or risk of bleeding:  no  DRAINS: NONE  SPECIMEN:  NONE  DISPOSITION OF SPECIMEN:  N/A  COUNTS:  YES  PLAN OF CARE: Discharge to home after PACU  PATIENT DISPOSITION:  PACU - hemodynamically stable.  INDICATION: Pleasant patient with obvious left groin pain and hernia.  Suspicion for right hernia as well.  Symptoms there now as well.  Small umbilical hernia.  I recommended laparoscopic exploration and repair of hernias found to  The anatomy & physiology of the abdominal wall and pelvic floor was discussed.  The pathophysiology of hernias in the inguinal and pelvic region was  discussed.  Natural history risks such as progressive enlargement, pain, incarceration & strangulation was discussed.   Contributors to complications such as smoking, obesity, diabetes, prior surgery, etc were discussed.    I feel the risks of no intervention will lead to serious problems that outweigh the operative risks; therefore, I recommended surgery to reduce and repair the hernia.  I explained laparoscopic techniques with possible need for an open approach.  I noted usual use of mesh to patch and/or buttress hernia repair  Risks such as bleeding, infection, abscess, need for further treatment, heart attack, death, and other risks were discussed.  I noted a good likelihood this will help address the problem.   Goals of post-operative recovery were discussed as well.  Possibility that this will not correct all symptoms was explained.  I stressed the importance of low-impact activity, aggressive pain control, avoiding constipation, & not pushing through pain to minimize risk of post-operative chronic pain or injury. Possibility of reherniation was discussed.  We will work to minimize complications.     An educational handout further explaining the pathology & treatment options was given as well.  Questions were answered.  The patient expresses understanding & wishes to proceed with surgery.  OR FINDINGS: Indirect inguinal hernia on the left side.  Mild laxity in direct space but no definite hernia there.  No femoral artery to hernia.  On the right side also an indirect inguinal hernia.  Laxity at direct space more significant with questionable hernia there.  No femoral nor obturator hernia.  Umbilical hernia 7 mm through the stalk itself.  Primarily repaired.  DESCRIPTION:  The patient was identified & brought into the  operating room. The patient was positioned supine with arms tucked. SCDs were active during the entire case. The patient underwent general anesthesia without any difficulty.  The  abdomen was prepped and draped in a sterile fashion. The patient's bladder was emptied.  A Surgical Timeout confirmed our plan.  I made a transverse incision through the inferior umbilical fold.  I made a small transverse nick through the anterior rectus fascia contralateral to the inguinal hernia side and placed a 0-vicryl stitch through the fascia.  I placed a Hasson trocar into the preperitoneal plane.  Entry was clean.  We induced carbon dioxide insufflation. Camera inspection revealed no injury.  I used a 4m angled scope to bluntly free the peritoneum off the infraumbilical anterior abdominal wall.  I created enough of a preperitoneal pocket to place 568mports into the right & left mid-abdomen into this preperitoneal cavity.  I focused attention on the LEFT pelvis since that was the dominant hernia side.   I used blunt & focused sharp dissection to free the peritoneum off the flank and down to the pubic rim.  I freed the anteriolateral bladder wall off the anteriolateral pelvic wall, sparing midline attachments.   I located a swath of peritoneum going into a hernia fascial defect at the  internal ring consistent with  an indirect inguinal hernia..  I gradually freed the peritoneal hernia sac off safely and reduced it into the preperitoneal space.  I freed the peritoneum off the spermatic vessels & vas deferens.  I freed peritoneum off the retroperitoneum along the psoas muscle.  Spermatic cord lipoma was dissected away & removed.  I checked & assured hemostasis.     I turned attention on the opposite  RIGHT pelvis.  I did dissection in a similar, mirror-image fashion. The patient had an indirect inguinal hernia.. Marland Kitchen Spermatic cord lipoma was dissected away & removed.    I checked & assured hemostasis.     I chose sheets of lightweight polypropylene Bard Soft Mesh 15x15cm, one for each side.  I cut a single sigmoid-shaped slit ~6cm from a corner of each mesh.  I placed the meshes into the preperitoneal  space & laid them as overlapping diamonds such that at the inferior points, a 6x6 cm corner flap rested in the true anterolateral pelvis, covering the obturator & femoral foramina.   I allowed the bladder to return to the pubis, this helping tuck the corners of the mesh in the anteriolateral pelvis.  The medial corners overlapped each other across midline cephalad to the pubic rim.   Given the numerous hernias of moderate size, I placed a third 15x15cm mesh in the center as a vertical diamond.  The lateral wings of the mesh overlap across the direct spaces and internal rings where the dominant hernias were.  This provided good coverage and reinforcement of the hernia repairs.  Because of the central mesh placement with good overlap, I did not place any tacks.   I held the hernia sacs cephalad & evacuated carbon dioxide.  I closed the fascia with absorbable suture.  I freed the umbilical stalk off of the fascia to expose the obvious umbilical hernia.  It was rather small so I did primary repair.  #1 PDS interrupted suture.  Umbilical stalk was tacked back down using 0 Vicryl suture.  Skin incisions closed using 4-0 monocryl stitch.  Sterile dressings were applied.   The patient was extubated & arrived in the PACU in stable condition..  I had  discussed postoperative care with the patient in the holding area.  Instructions are written in the chart.  I discussed operative findings, updated the patient's status, discussed probable steps to recovery, and gave postoperative recommendations to the patient's spouse, Kennyth Lose.  Recommendations were made.  Questions were answered.  She expressed understanding & appreciation.   Adin Hector, M.D., F.A.C.S. Gastrointestinal and Minimally Invasive Surgery [facility] [address], [place], [place] [id number] [phone] Main / Paging  04/07/2022 5:44 PM

## 2022-04-07 NOTE — Interval H&P Note (Signed)
History and Physical Interval Note:  04/07/2022 2:53 PM  Drew Wyatt  has presented today for surgery, with the diagnosis of LEFT INGUINAL HERNIA, UMBILICAL HERNIA POSSIBLE RIGHT HERNIA.  The various methods of treatment have been discussed with the patient and family. After consideration of risks, benefits and other options for treatment, the patient has consented to  Procedure(s): LAPAROSCOPIC LEFT AND POSSIBLE RIGHT INGUINAL HERNIA (N/A) HERNIA REPAIR UMBILICAL ADULT (N/A) as a surgical intervention.  The patient's history has been reviewed, patient examined, no change in status, stable for surgery.  I have reviewed the patient's chart and labs.  Questions were answered to the patient's satisfaction.    I have re-reviewed the the patient's records, history, medications, and allergies.  I have re-examined the patient.  I again discussed intraoperative plans and goals of post-operative recovery.  The patient agrees to proceed.  Drew Wyatt  07-01-1946 [id number]  Patient Care Team: Cari Caraway, MD as PCP - General (Family Medicine) Michael Boston, MD as Consulting Physician (General Surgery) Pyrtle, Lajuan Lines, MD as Consulting Physician (Gastroenterology)  Patient Active Problem List   Diagnosis Date Noted   Quadriceps muscle rupture, left, initial encounter 02/04/2017   Soft tissue mass, chest wall 09/08/2015   Colon polyps    Chronic back pain    Hypercholesteremia     Past Medical History:  Diagnosis Date   Anxiety    Arthritis    Chronic back pain    Colon polyps    Gout    Heart murmur    Childhood   History of hiatal hernia    Hypercholesteremia     Past Surgical History:  Procedure Laterality Date   colonscopy     several   EXCISION MASS UPPER EXTREMETIES Right 09/09/2015   Procedure: EXCISION MASS UPPER EXTREMETIES;  Surgeon: Armandina Gemma, MD;  Location: [facility];  Service: General;  Laterality: Right;   EYE SURGERY Bilateral    cataract   FOOT SURGERY Left     Achilles tendon   KNEE SURGERY Right    meniscus repair   left thumb surgery  yrs ago   Lymphoepithelial Cyst  Throat   MASS EXCISION Left 09/09/2015   Procedure: EXCISION SOFT TISSUE MASS LEFT FLANK ;  Surgeon: Armandina Gemma, MD;  Location: [facility];  Service: General;  Laterality: Left;   MEDIAL COLLATERAL LIGAMENT REPAIR, ELBOW Right    NASAL ENDOSCOPY  25 yrs ago   ORIF CLAVICULAR FRACTURE Left 02/04/2017   Procedure: OPEN REDUCTION INTERNAL FIXATION (ORIF) CLAVICULAR FRACTURE;  Surgeon: Netta Cedars, MD;  Location: [place];  Service: Orthopedics;  Laterality: Left;   QUADRICEPS TENDON REPAIR Left 02/04/2017   Procedure: REPAIR QUADRICEP TENDON;  Surgeon: Netta Cedars, MD;  Location: [place];  Service: Orthopedics;  Laterality: Left;   ROTATOR CUFF REPAIR Right 1998    Social History   Socioeconomic History   Marital status: Married    Spouse name: Not on file   Number of children: Not on file   Years of education: Not on file   Highest education level: Not on file  Occupational History   Not on file  Tobacco Use   Smoking status: Former   Smokeless tobacco: Never   Tobacco comments:    in college only  Vaping Use   Vaping Use: Never used  Substance and Sexual Activity   Alcohol use: Yes    Alcohol/week: 4.0 standard drinks of alcohol    Types: 4 Glasses of wine per week  Drug use: No   Sexual activity: Not on file  Other Topics Concern   Not on file  Social History Narrative   Not on file   Social Determinants of Health   Financial Resource Strain: Not on file  Food Insecurity: Not on file  Transportation Needs: Not on file  Physical Activity: Not on file  Stress: Not on file  Social Connections: Not on file  Intimate Partner Violence: Not on file    Family History  Problem Relation Age of Onset   Breast cancer Mother    Lung cancer Father    Colon cancer Neg Hx    Esophageal cancer Neg Hx    Rectal cancer Neg Hx    Stomach cancer Neg Hx      Facility-Administered Medications Prior to Admission  Medication Dose Route Frequency Provider Last Rate Last Admin   0.9 %  sodium chloride infusion  500 mL Intravenous Once Pyrtle, Lajuan Lines, MD       Medications Prior to Admission  Medication Sig Dispense Refill Last Dose   allopurinol (ZYLOPRIM) 300 MG tablet Take 300 mg by mouth in the morning.   04/07/2022 at 0730   aspirin EC 81 MG tablet Take 81 mg by mouth at bedtime.   04/05/2022   atorvastatin (LIPITOR) 40 MG tablet Take 40 mg by mouth at bedtime.   04/06/2022 at 2230   cholecalciferol (VITAMIN D) 1000 UNITS tablet Take 1,000 Units by mouth in the morning.   04/07/2022 at 0730   melatonin 3 MG TABS tablet Take 3 mg by mouth at bedtime.   04/06/2022   sertraline (ZOLOFT) 100 MG tablet Take 100 mg by mouth at bedtime.   04/06/2022 at 2230   sildenafil (VIAGRA) 50 MG tablet Take 50 mg by mouth daily as needed for erectile dysfunction.   Past Week    Current Facility-Administered Medications  Medication Dose Route Frequency Provider Last Rate Last Admin   bupivacaine liposome (EXPAREL) 1.3 % injection 266 mg  20 mL Infiltration Once Michael Boston, MD       ceFAZolin (ANCEF) IVPB 2g/100 mL premix  2 g Intravenous On Call to OR Michael Boston, MD       Chlorhexidine Gluconate Cloth 2 % PADS 6 each  6 each Topical Once Michael Boston, MD       And   Chlorhexidine Gluconate Cloth 2 % PADS 6 each  6 each Topical Once Michael Boston, MD       Derrill Memo ON 04/08/2022] feeding supplement (ENSURE PRE-SURGERY) liquid 296 mL  296 mL Oral Once Michael Boston, MD       gabapentin (NEURONTIN) capsule 300 mg  300 mg Oral On Call to OR Michael Boston, MD       lactated ringers infusion   Intravenous Continuous Myrtie Soman, MD 10 mL/hr at 04/07/22 1357 New Bag at 04/07/22 1357     No Known Allergies  BP 139/80   Pulse 63   Temp 97.6 F (36.4 C) (Oral)   Resp 18   Ht 6' (1.829 m)   Wt 78.4 kg   SpO2 97%   BMI 23.44 kg/m   Labs: No results found for  this or any previous visit (from the past 48 hour(s)).  Imaging / Studies: CT Abdomen Pelvis W Contrast  Addendum Date: 03/26/2022   ADDENDUM REPORT: 03/26/2022 15:50 ADDENDUM: Correction to report. 11 mm hyperenhancing nodule in the spleen, series 2, image 16, indeterminate but probably benign, suggest 6 month MRI follow-up.  Electronically Signed   By: Donavan Foil M.D.   On: 03/26/2022 15:50   Result Date: 03/26/2022 CLINICAL DATA:  Left inguinal hernia EXAM: CT ABDOMEN AND PELVIS WITH CONTRAST TECHNIQUE: Multidetector CT imaging of the abdomen and pelvis was performed using the standard protocol following bolus administration of intravenous contrast. RADIATION DOSE REDUCTION: This exam was performed according to the departmental dose-optimization program which includes automated exposure control, adjustment of the mA and/or kV according to patient size and/or use of iterative reconstruction technique. CONTRAST:  168m OMNIPAQUE IOHEXOL 300 MG/ML  SOLN COMPARISON:  Ultrasound 03/02/2022 FINDINGS: Lower chest: Lung bases demonstrate no acute airspace disease. Hepatobiliary: No focal liver abnormality is seen. No gallstones, gallbladder wall thickening, or biliary dilatation. Pancreas: Unremarkable. No pancreatic ductal dilatation or surrounding inflammatory changes. Spleen: Normal in size without focal abnormality. Adrenals/Urinary Tract: Adrenal glands are unremarkable. Kidneys are normal, without renal calculi, focal lesion, or hydronephrosis. Bladder is unremarkable. Stomach/Bowel: Stomach is within normal limits. Appendix appears normal. No evidence of bowel wall thickening, distention, or inflammatory changes. Vascular/Lymphatic: Mild aortic atherosclerosis. No aneurysm. No suspicious lymph nodes. Reproductive: Prostate slightly enlarged Other: Negative for pelvic effusion or free air. Small fat containing umbilical hernia. Small fat containing left inguinal hernia. No bowel containing hernia at this  time. Musculoskeletal: Chronic bilateral pars defect at L5 with advanced degenerative changes IMPRESSION: 1. Small fat containing left inguinal hernia, no bowel containing hernia on this exam. 2. No CT evidence for acute intra-abdominal or pelvic abnormality Electronically Signed: By: KDonavan FoilM.D. On: 03/26/2022 15:27     .SAdin Hector M.D., F.A.C.S. Gastrointestinal and Minimally Invasive Surgery [facility] [address] [place] [phone] / Paging  04/07/2022 2:53 PM    SAdin Hector

## 2022-04-07 NOTE — Transfer of Care (Signed)
Immediate Anesthesia Transfer of Care Note  Patient: Drew Wyatt  Procedure(s) Performed: LAPAROSCOPIC BILATERAL INGUINAL HERNIA REPAIR WITH MESH, UMBILICAL HERNIA , TAP BLOCK (Abdomen) HERNIA REPAIR UMBILICAL ADULT  Patient Location: PACU  Anesthesia Type:General  Level of Consciousness: drowsy  Airway & Oxygen Therapy: Patient Spontanous Breathing and Patient connected to face mask oxygen  Post-op Assessment: Report given to RN and Post -op Vital signs reviewed and stable  Post vital signs: Reviewed and stable  Last Vitals:  Vitals Value Taken Time  BP    Temp    Pulse    Resp    SpO2      Last Pain:  Vitals:   04/07/22 1344  TempSrc:   PainSc: 0-No pain      Patients Stated Pain Goal: 3 (08/65/78 4696)  Complications: No notable events documented.

## 2022-04-07 NOTE — Addendum Note (Signed)
Addendum  created 04/07/22 2030 by Myrtie Soman, MD   Clinical Note Signed

## 2022-04-07 NOTE — Anesthesia Preprocedure Evaluation (Signed)
Anesthesia Evaluation  Patient identified by MRN, date of birth, ID band Patient awake    Reviewed: Allergy & Precautions, NPO status , Patient's Chart, lab work & pertinent test results  Airway Mallampati: II  TM Distance: >3 FB Neck ROM: Full    Dental no notable dental hx.    Pulmonary neg pulmonary ROS, former smoker,    Pulmonary exam normal breath sounds clear to auscultation       Cardiovascular negative cardio ROS Normal cardiovascular exam Rhythm:Regular Rate:Normal     Neuro/Psych negative neurological ROS  negative psych ROS   GI/Hepatic negative GI ROS, Neg liver ROS,   Endo/Other  negative endocrine ROS  Renal/GU negative Renal ROS  negative genitourinary   Musculoskeletal negative musculoskeletal ROS (+)   Abdominal   Peds negative pediatric ROS (+)  Hematology negative hematology ROS (+)   Anesthesia Other Findings   Reproductive/Obstetrics negative OB ROS                             Anesthesia Physical Anesthesia Plan  ASA: 2  Anesthesia Plan: General   Post-op Pain Management: Minimal or no pain anticipated   Induction: Intravenous  PONV Risk Score and Plan: 2 and Ondansetron, Dexamethasone and Treatment may vary due to age or medical condition  Airway Management Planned: Oral ETT  Additional Equipment:   Intra-op Plan:   Post-operative Plan: Extubation in OR  Informed Consent: I have reviewed the patients History and Physical, chart, labs and discussed the procedure including the risks, benefits and alternatives for the proposed anesthesia with the patient or authorized representative who has indicated his/her understanding and acceptance.     Dental advisory given  Plan Discussed with: CRNA and Surgeon  Anesthesia Plan Comments:         Anesthesia Quick Evaluation

## 2022-04-07 NOTE — Anesthesia Postprocedure Evaluation (Addendum)
Anesthesia Post Note  Patient: Drew Wyatt  Procedure(s) Performed: LAPAROSCOPIC BILATERAL INGUINAL HERNIA REPAIR WITH MESH, UMBILICAL HERNIA , TAP BLOCK (Abdomen) HERNIA REPAIR UMBILICAL ADULT     Patient location during evaluation: PACU Anesthesia Type: General Level of consciousness: awake and alert Pain management: pain level controlled Vital Signs Assessment: post-procedure vital signs reviewed and stable Respiratory status: spontaneous breathing, nonlabored ventilation, respiratory function stable and patient connected to nasal cannula oxygen Cardiovascular status: blood pressure returned to baseline and stable Postop Assessment: no apparent nausea or vomiting Anesthetic complications: no Comments: CAlled at 19:37 and informed that patient had blacked out while on toilet, fell off the toilet and hit his head. He was found on the floor by the PACU nurse after she heard him fall. He has a small bump on his head but otherwise felt fine afterwards. He was re connected to monitors and was normotensive with a normal pulse.  I came in from home to examine him. He had no mental status changes, BP and pulse remained normal. Patient says he feels fine. He remembers feeling nauseous briefly and then woke up on the floor. His wife was brought to bedside after I arrived. Told both of them to return to hospital if he had any mental status changes, significant neck pain, or severe nausea. I gave him a 500cc fluid bolus and kept him for an hour. The bump on his head decreased in size during this time.  He is fine for discharge.    No notable events documented.  Last Vitals:  Vitals:   04/07/22 1335  BP: 139/80  Pulse: 63  Resp: 18  Temp: 36.4 C  SpO2: 97%    Last Pain:  Vitals:   04/07/22 1344  TempSrc:   PainSc: 0-No pain                 Anderson Middlebrooks S
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
## 2022-04-07 NOTE — Discharge Instructions (Signed)
HERNIA REPAIR: POST OP INSTRUCTIONS  ######################################################################  EAT Gradually transition to a high fiber diet with a fiber supplement over the next few weeks after discharge.  Start with a pureed / full liquid diet (see below)  WALK Walk an hour a day.  Control your pain to do that.    CONTROL PAIN Control pain so that you can walk, sleep, tolerate sneezing/coughing, and go up/down stairs.  HAVE A BOWEL MOVEMENT DAILY Keep your bowels regular to avoid problems.  OK to try a laxative to override constipation.  OK to use an antidairrheal to slow down diarrhea.  Call if not better after 2 tries  CALL IF YOU HAVE PROBLEMS/CONCERNS Call if you are still struggling despite following these instructions. Call if you have concerns not answered by these instructions  ######################################################################    DIET: Follow a light bland diet & liquids the first 24 hours after arrival home, such as soup, liquids, starches, etc.  Be sure to drink plenty of fluids.  Quickly advance to a usual solid diet within a few days.  Avoid fast food or heavy meals as your are more likely to get nauseated or have irregular bowels.  A low-fat, high-fiber diet for the rest of your life is ideal.   Take your usually prescribed home medications unless otherwise directed.  PAIN CONTROL: Pain is best controlled by a usual combination of three different methods TOGETHER: Ice/Heat Over the counter pain medication Prescription pain medication Most patients will experience some swelling and bruising around the hernia(s) such as the bellybutton, groins, or old incisions.  Ice packs or heating pads (30-60 minutes up to 6 times a day) will help. Use ice for the first few days to help decrease swelling and bruising, then switch to heat to help relax tight/sore spots and speed recovery.  Some people prefer to use ice alone, heat alone, alternating  between ice & heat.  Experiment to what works for you.  Swelling and bruising can take several weeks to resolve.   It is helpful to take an over-the-counter pain medication regularly for the first few weeks.  Choose one of the following that works best for you: Naproxen (Aleve, etc)  Two 220mg tabs twice a day Ibuprofen (Advil, etc) Three 200mg tabs four times a day (every meal & bedtime) Acetaminophen (Tylenol, etc) 325-650mg four times a day (every meal & bedtime) A  prescription for pain medication should be given to you upon discharge.  Take your pain medication as prescribed.  If you are having problems/concerns with the prescription medicine (does not control pain, nausea, vomiting, rash, itching, etc), please call us [phone] to see if we need to switch you to a different pain medicine that will work better for you and/or control your side effect better. If you need a refill on your pain medication, please contact your pharmacy.  They will contact our office to request authorization. Prescriptions will not be filled after 5 pm or on week-ends.  Avoid getting constipated.  Between the surgery and the pain medications, it is common to experience some constipation.  Increasing fluid intake and taking a fiber supplement (such as Metamucil, Citrucel, FiberCon, MiraLax, etc) 1-2 times a day regularly will usually help prevent this problem from occurring.  A mild laxative (prune juice, Milk of Magnesia, MiraLax, etc) should be taken according to package directions if there are no bowel movements after 48 hours.    Wash / shower every day.  You may shower over the dressings   as they are waterproof.    It is good for closed incisions and even open wounds to be washed every day.  Shower every day.  Short baths are fine.  Wash the incisions and wounds clean with soap & water.    You may leave closed incisions open to air if it is dry.   You may cover the incision with clean gauze & replace it after  your daily shower for comfort.  TEGADERM:  You have clear gauze band-aid dressings over your closed incision(s).  Remove the dressings 3 days after surgery.    ACTIVITIES as tolerated:   You may resume regular (light) daily activities beginning the next day--such as daily self-care, walking, climbing stairs--gradually increasing activities as tolerated.  Control your pain so that you can walk an hour a day.  If you can walk 30 minutes without difficulty, it is safe to try more intense activity such as jogging, treadmill, bicycling, low-impact aerobics, swimming, etc. Save the most intensive and strenuous activity for last such as sit-ups, heavy lifting, contact sports, etc  Refrain from any heavy lifting or straining until you are off narcotics for pain control.   DO NOT PUSH THROUGH PAIN.  Let pain be your guide: If it hurts to do something, don't do it.  Pain is your body warning you to avoid that activity for another week until the pain goes down. You may drive when you are no longer taking prescription pain medication, you can comfortably wear a seatbelt, and you can safely maneuver your car and apply brakes. You may have sexual intercourse when it is comfortable.   FOLLOW UP in our office Please call [facility] at [phone] to set up an appointment to see your surgeon in the office for a follow-up appointment approximately 2-3 weeks after your surgery. Make sure that you call for this appointment the day you arrive home to insure a convenient appointment time.  9.  If you have disability of FMLA / Family leave forms, please bring the forms to the office for processing.  (do not give to your surgeon).  WHEN TO CALL US [phone]: Poor pain control Reactions / problems with new medications (rash/itching, nausea, etc)  Fever over 101.5 F (38.5 C) Inability to urinate Nausea and/or vomiting Worsening swelling or bruising Continued bleeding from incision. Increased pain, redness, or  drainage from the incision   The clinic staff is available to answer your questions during regular business hours (8:30am-5pm).  Please don't hesitate to call and ask to speak to one of our nurses for clinical concerns.   If you have a medical emergency, go to the nearest emergency room or call 911.  A surgeon from [facility] is always on call at the hospitals in [place]  [facility] [address], [address], [place], [place]  [address] ?  [address] [address], [place], [place]   [address] MAIN: [phone] ? TOLL FREE: [phone] ? FAX: [phone] [email or web address]  

## 2022-04-07 NOTE — Progress Notes (Signed)
The patient was ambulated to the bathroom, he was stable and steady walking to the toilet. Patient voided and was stable on the toilet. Patient fell off the toilet and patient reports he bumped his head MD aware of the event. Ice pack was placed on the back of the patient's head. Vital signs were stable after fall, neuro is intact, patient states he is not in pain. Rose MD came to assess the patient at the bedside. No x-rays or CT needed. MD cleared patient to go home, patient was educated on signs of concussion. 500 mL Lactated Ringers Bolus was given. Patient is eating dinner and drinking fluids.

## 2022-04-08 ENCOUNTER — Encounter (HOSPITAL_COMMUNITY): Payer: Self-pay | Admitting: Surgery

## 2022-04-08 ENCOUNTER — Other Ambulatory Visit: Payer: Self-pay

## 2022-04-20 DIAGNOSIS — D2272 Melanocytic nevi of left lower limb, including hip: Secondary | ICD-10-CM | POA: Diagnosis not present

## 2022-04-20 DIAGNOSIS — D225 Melanocytic nevi of trunk: Secondary | ICD-10-CM | POA: Diagnosis not present

## 2022-04-20 DIAGNOSIS — L821 Other seborrheic keratosis: Secondary | ICD-10-CM | POA: Diagnosis not present

## 2022-04-20 DIAGNOSIS — B351 Tinea unguium: Secondary | ICD-10-CM | POA: Diagnosis not present

## 2022-06-17 DIAGNOSIS — Z23 Encounter for immunization: Secondary | ICD-10-CM | POA: Diagnosis not present

## 2022-07-05 DIAGNOSIS — Z23 Encounter for immunization: Secondary | ICD-10-CM | POA: Diagnosis not present

## 2022-07-19 ENCOUNTER — Telehealth: Payer: Self-pay | Admitting: Physician Assistant

## 2022-07-19 ENCOUNTER — Other Ambulatory Visit: Payer: Self-pay

## 2022-07-19 DIAGNOSIS — D7389 Other diseases of spleen: Secondary | ICD-10-CM

## 2022-07-19 DIAGNOSIS — K4090 Unilateral inguinal hernia, without obstruction or gangrene, not specified as recurrent: Secondary | ICD-10-CM

## 2022-07-19 DIAGNOSIS — R9389 Abnormal findings on diagnostic imaging of other specified body structures: Secondary | ICD-10-CM

## 2022-07-19 NOTE — Telephone Encounter (Signed)
Inbound call from patient requesting to have another ct of abdomen, please advise.

## 2022-07-19 NOTE — Telephone Encounter (Signed)
Patient called in to schedule 6 month follow up MRI that is due for December. Scheduled for 09/13/22 at 10:00 am. NPO 4 hours prior. Patient provided scheduling number in case he needs to reschedule closer to the time.

## 2022-08-11 DIAGNOSIS — Z961 Presence of intraocular lens: Secondary | ICD-10-CM | POA: Diagnosis not present

## 2022-08-11 DIAGNOSIS — H04123 Dry eye syndrome of bilateral lacrimal glands: Secondary | ICD-10-CM | POA: Diagnosis not present

## 2022-08-11 DIAGNOSIS — H43812 Vitreous degeneration, left eye: Secondary | ICD-10-CM | POA: Diagnosis not present

## 2022-08-11 DIAGNOSIS — G43109 Migraine with aura, not intractable, without status migrainosus: Secondary | ICD-10-CM | POA: Diagnosis not present

## 2022-09-01 DIAGNOSIS — E782 Mixed hyperlipidemia: Secondary | ICD-10-CM | POA: Diagnosis not present

## 2022-09-01 DIAGNOSIS — R7301 Impaired fasting glucose: Secondary | ICD-10-CM | POA: Diagnosis not present

## 2022-09-01 DIAGNOSIS — M1000 Idiopathic gout, unspecified site: Secondary | ICD-10-CM | POA: Diagnosis not present

## 2022-09-01 DIAGNOSIS — R7309 Other abnormal glucose: Secondary | ICD-10-CM | POA: Diagnosis not present

## 2022-09-10 DIAGNOSIS — Z23 Encounter for immunization: Secondary | ICD-10-CM | POA: Diagnosis not present

## 2022-09-10 DIAGNOSIS — F418 Other specified anxiety disorders: Secondary | ICD-10-CM | POA: Diagnosis not present

## 2022-09-10 DIAGNOSIS — M1000 Idiopathic gout, unspecified site: Secondary | ICD-10-CM | POA: Diagnosis not present

## 2022-09-10 DIAGNOSIS — Z0000 Encounter for general adult medical examination without abnormal findings: Secondary | ICD-10-CM | POA: Diagnosis not present

## 2022-09-10 DIAGNOSIS — R001 Bradycardia, unspecified: Secondary | ICD-10-CM | POA: Diagnosis not present

## 2022-09-10 DIAGNOSIS — N529 Male erectile dysfunction, unspecified: Secondary | ICD-10-CM | POA: Diagnosis not present

## 2022-09-10 DIAGNOSIS — G250 Essential tremor: Secondary | ICD-10-CM | POA: Diagnosis not present

## 2022-09-10 DIAGNOSIS — R7301 Impaired fasting glucose: Secondary | ICD-10-CM | POA: Diagnosis not present

## 2022-09-10 DIAGNOSIS — E782 Mixed hyperlipidemia: Secondary | ICD-10-CM | POA: Diagnosis not present

## 2022-09-10 DIAGNOSIS — Z6823 Body mass index (BMI) 23.0-23.9, adult: Secondary | ICD-10-CM | POA: Diagnosis not present

## 2022-09-10 DIAGNOSIS — G43109 Migraine with aura, not intractable, without status migrainosus: Secondary | ICD-10-CM | POA: Diagnosis not present

## 2022-09-13 ENCOUNTER — Ambulatory Visit (HOSPITAL_COMMUNITY)
Admission: RE | Admit: 2022-09-13 | Discharge: 2022-09-13 | Disposition: A | Discharge status: Home / Self Care | Payer: Medicare Other | Source: Ambulatory Visit | Attending: Physician Assistant | Admitting: Physician Assistant

## 2022-09-13 DIAGNOSIS — R9389 Abnormal findings on diagnostic imaging of other specified body structures: Secondary | ICD-10-CM | POA: Diagnosis not present

## 2022-09-13 DIAGNOSIS — N281 Cyst of kidney, acquired: Secondary | ICD-10-CM | POA: Diagnosis not present

## 2022-09-13 DIAGNOSIS — D7389 Other diseases of spleen: Secondary | ICD-10-CM | POA: Diagnosis not present

## 2022-09-13 MED ORDER — GADOBUTROL 1 MMOL/ML IV SOLN
7.5000 mL | Freq: Once | INTRAVENOUS | Status: AC | PRN
  Administered 2022-09-13: 7.5 mL via INTRAVENOUS

## 2022-12-22 IMAGING — US US PELVIS LIMITED
2 series · 15 of 25 positions shown · non-contrast
Comparison: None Available.

CLINICAL DATA: Left inguinal pain and bulge

EXAM:
LIMITED ULTRASOUND OF PELVIS
TECHNIQUE: Limited transabdominal ultrasound examination of the pelvis was
performed.

[Series 1: us abdomen limited mc & wl · 21 acquisitions, 8 frames shown (1 of 2)]
[im 1/21]
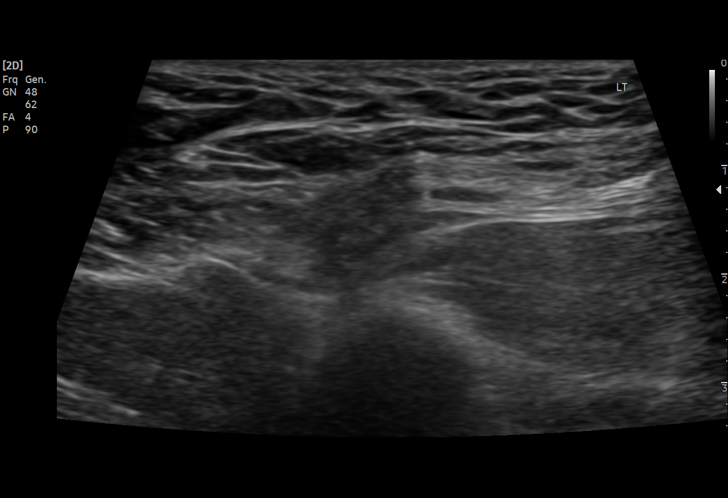
[im 4/21]
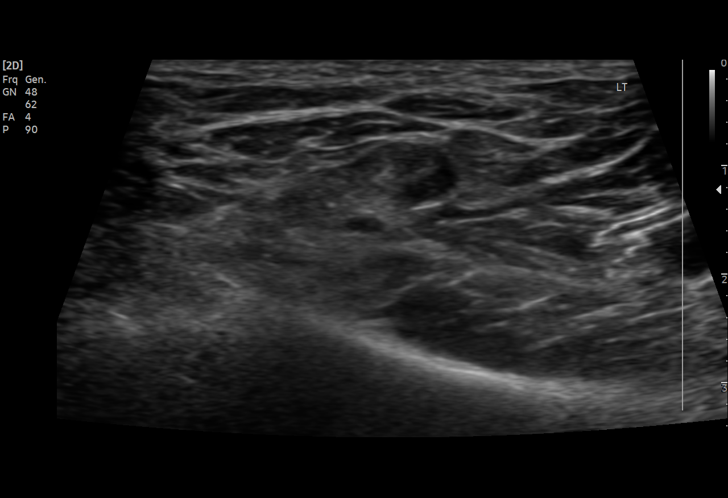
[im 7/21]
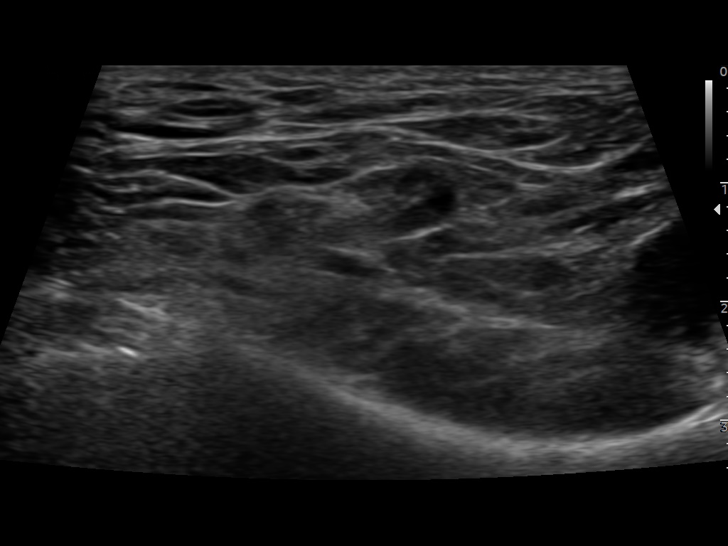
[im 9/21]
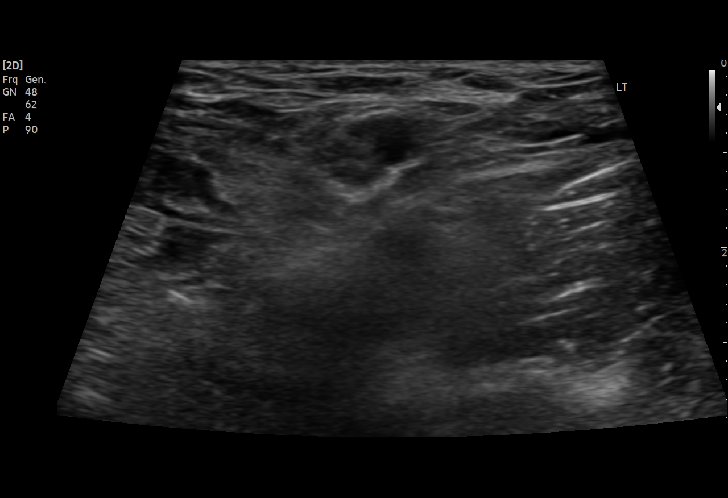
[im 12/21]
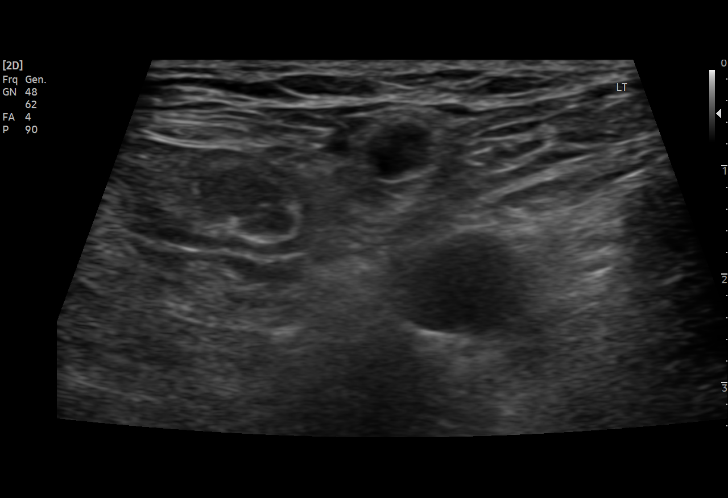
[im 16/21]
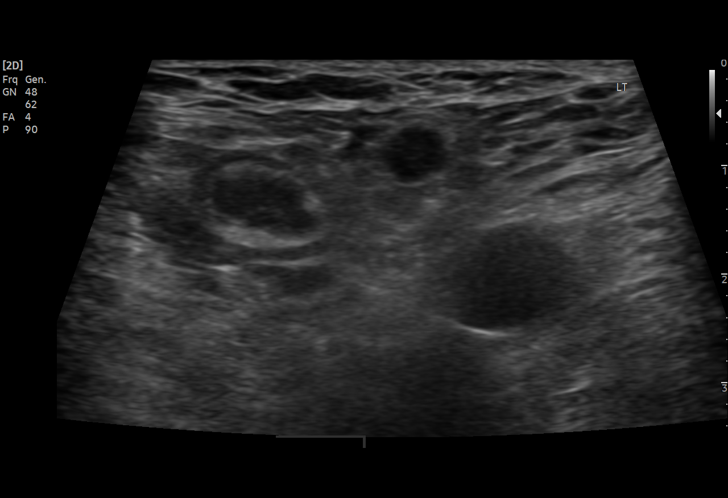
[im 17/21]
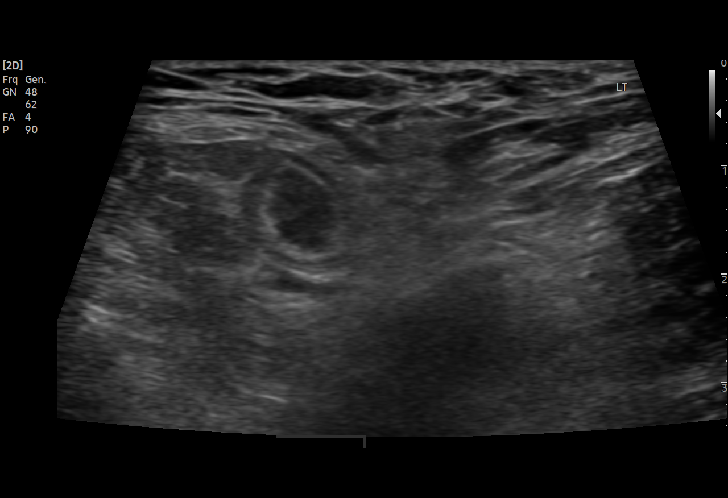
[im 21/21]
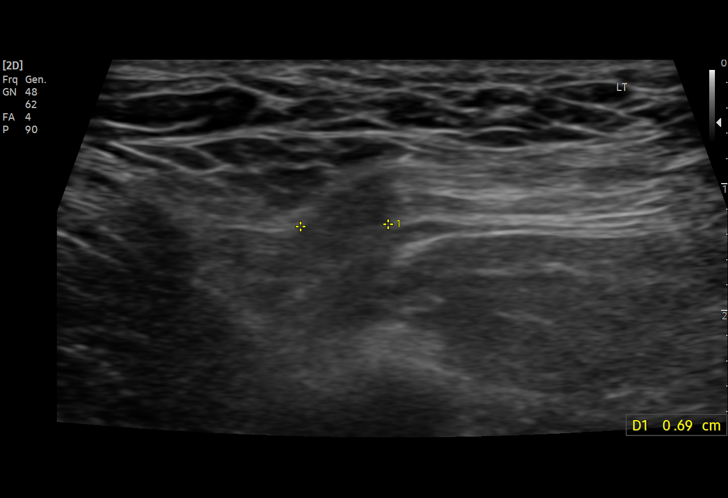

[Series 2: us abdomen limited mc & wl · 20 acquisitions, 7 frames shown (2 of 2)]
[im 2/20]
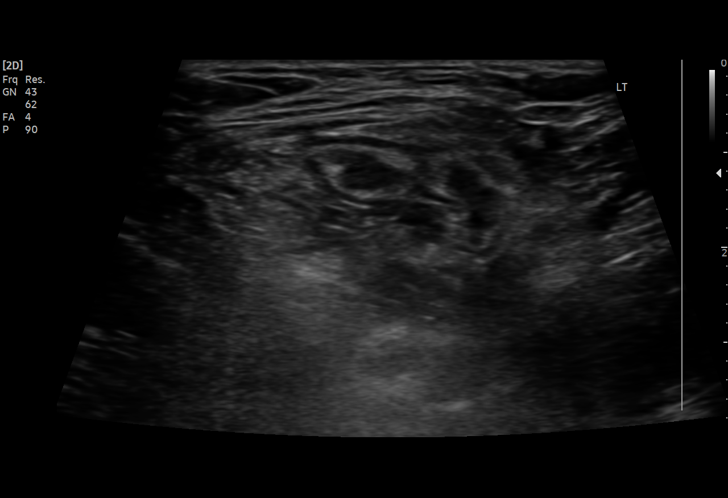
[im 4/20]
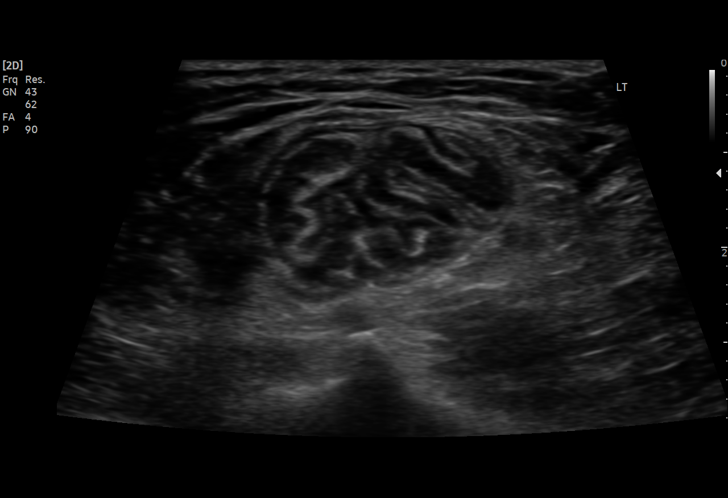
[im 7/20]
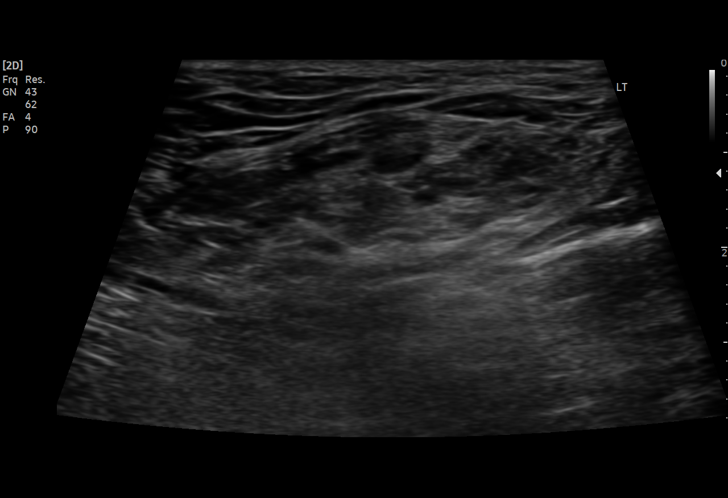
[im 11/20]
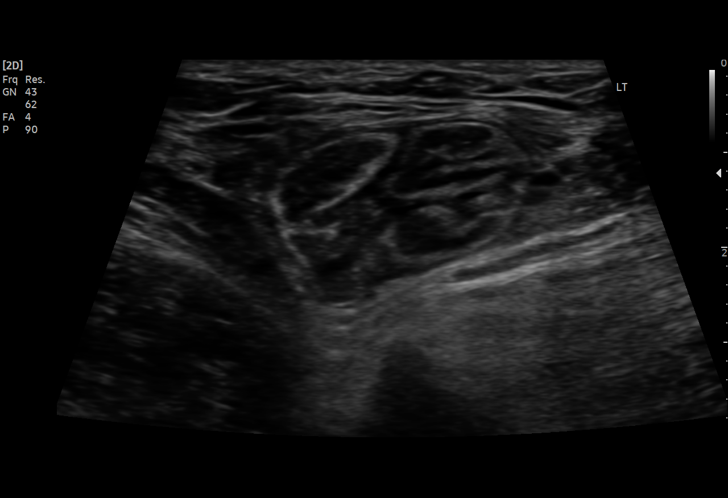
[im 13/20]
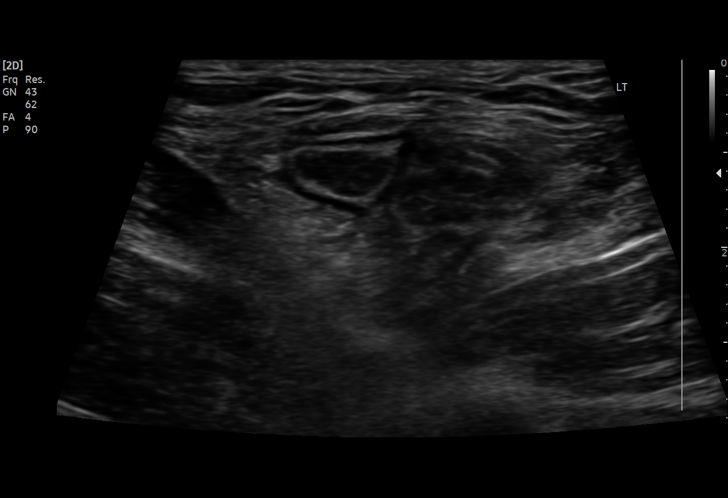
[im 16/20]
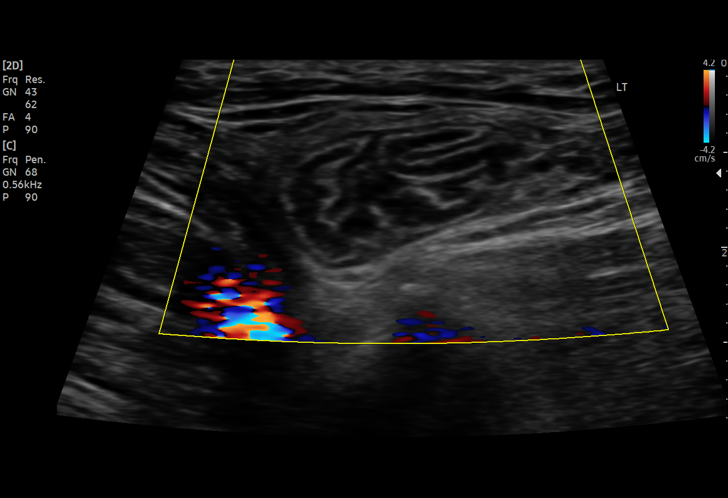
[im 20/20]
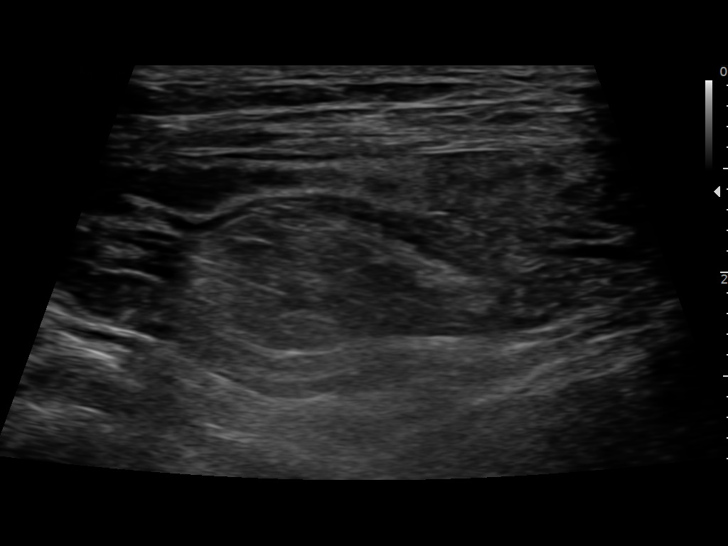

[15 of 25 positions shown; findings below may reference images not displayed]

FINDINGS: Ultrasound evaluation of the abdominal wall on the left inguinal
canal demonstrates a small fat containing inguinal hernia, only
present with provocative maneuvers.

Loop of small bowel in the left lower pelvis along the inguinal
canal does not appear to extend through a fascial defect but does
correspond with the patient's palpable area of concern.
IMPRESSION: Small fat containing left inguinal hernia.

Loop of small bowel in the left anterior lower pelvis along the
inguinal canal does not appear to extend through a fascial defect
but does correspond with the patient's palpable area of concern,
consider further evaluation with contrasted CT.

## 2022-12-30 DIAGNOSIS — Z23 Encounter for immunization: Secondary | ICD-10-CM | POA: Diagnosis not present

## 2023-04-21 DIAGNOSIS — L570 Actinic keratosis: Secondary | ICD-10-CM | POA: Diagnosis not present

## 2023-04-21 DIAGNOSIS — D225 Melanocytic nevi of trunk: Secondary | ICD-10-CM | POA: Diagnosis not present

## 2023-04-21 DIAGNOSIS — D485 Neoplasm of uncertain behavior of skin: Secondary | ICD-10-CM | POA: Diagnosis not present

## 2023-04-21 DIAGNOSIS — D2271 Melanocytic nevi of right lower limb, including hip: Secondary | ICD-10-CM | POA: Diagnosis not present

## 2023-04-21 DIAGNOSIS — D2239 Melanocytic nevi of other parts of face: Secondary | ICD-10-CM | POA: Diagnosis not present

## 2023-04-21 DIAGNOSIS — D224 Melanocytic nevi of scalp and neck: Secondary | ICD-10-CM | POA: Diagnosis not present

## 2023-04-21 DIAGNOSIS — D2272 Melanocytic nevi of left lower limb, including hip: Secondary | ICD-10-CM | POA: Diagnosis not present

## 2023-04-21 DIAGNOSIS — L821 Other seborrheic keratosis: Secondary | ICD-10-CM | POA: Diagnosis not present

## 2023-04-21 DIAGNOSIS — L720 Epidermal cyst: Secondary | ICD-10-CM | POA: Diagnosis not present

## 2023-05-09 DIAGNOSIS — L988 Other specified disorders of the skin and subcutaneous tissue: Secondary | ICD-10-CM | POA: Diagnosis not present

## 2023-05-09 DIAGNOSIS — D485 Neoplasm of uncertain behavior of skin: Secondary | ICD-10-CM | POA: Diagnosis not present

## 2023-06-20 DIAGNOSIS — Z23 Encounter for immunization: Secondary | ICD-10-CM | POA: Diagnosis not present

## 2023-07-03 DIAGNOSIS — Z23 Encounter for immunization: Secondary | ICD-10-CM | POA: Diagnosis not present

## 2023-08-28 DIAGNOSIS — G4752 REM sleep behavior disorder: Secondary | ICD-10-CM | POA: Diagnosis not present

## 2023-09-06 DIAGNOSIS — M1000 Idiopathic gout, unspecified site: Secondary | ICD-10-CM | POA: Diagnosis not present

## 2023-09-06 DIAGNOSIS — R7301 Impaired fasting glucose: Secondary | ICD-10-CM | POA: Diagnosis not present

## 2023-09-06 DIAGNOSIS — E782 Mixed hyperlipidemia: Secondary | ICD-10-CM | POA: Diagnosis not present

## 2023-09-14 DIAGNOSIS — E782 Mixed hyperlipidemia: Secondary | ICD-10-CM | POA: Diagnosis not present

## 2023-09-14 DIAGNOSIS — R7301 Impaired fasting glucose: Secondary | ICD-10-CM | POA: Diagnosis not present

## 2023-09-14 DIAGNOSIS — G250 Essential tremor: Secondary | ICD-10-CM | POA: Diagnosis not present

## 2023-09-14 DIAGNOSIS — Z1331 Encounter for screening for depression: Secondary | ICD-10-CM | POA: Diagnosis not present

## 2023-09-14 DIAGNOSIS — G43109 Migraine with aura, not intractable, without status migrainosus: Secondary | ICD-10-CM | POA: Diagnosis not present

## 2023-09-14 DIAGNOSIS — M26643 Arthritis of bilateral temporomandibular joint: Secondary | ICD-10-CM | POA: Diagnosis not present

## 2023-09-14 DIAGNOSIS — N529 Male erectile dysfunction, unspecified: Secondary | ICD-10-CM | POA: Diagnosis not present

## 2023-09-14 DIAGNOSIS — G479 Sleep disorder, unspecified: Secondary | ICD-10-CM | POA: Diagnosis not present

## 2023-09-14 DIAGNOSIS — F418 Other specified anxiety disorders: Secondary | ICD-10-CM | POA: Diagnosis not present

## 2023-09-14 DIAGNOSIS — Z0000 Encounter for general adult medical examination without abnormal findings: Secondary | ICD-10-CM | POA: Diagnosis not present

## 2023-09-14 DIAGNOSIS — Z2911 Encounter for prophylactic immunotherapy for respiratory syncytial virus (RSV): Secondary | ICD-10-CM | POA: Diagnosis not present

## 2023-09-14 DIAGNOSIS — M1000 Idiopathic gout, unspecified site: Secondary | ICD-10-CM | POA: Diagnosis not present

## 2023-09-19 DIAGNOSIS — H43813 Vitreous degeneration, bilateral: Secondary | ICD-10-CM | POA: Diagnosis not present

## 2023-09-19 DIAGNOSIS — Z961 Presence of intraocular lens: Secondary | ICD-10-CM | POA: Diagnosis not present

## 2023-09-19 DIAGNOSIS — H04123 Dry eye syndrome of bilateral lacrimal glands: Secondary | ICD-10-CM | POA: Diagnosis not present

## 2023-09-19 DIAGNOSIS — G43109 Migraine with aura, not intractable, without status migrainosus: Secondary | ICD-10-CM | POA: Diagnosis not present

## 2023-12-06 DIAGNOSIS — Z20828 Contact with and (suspected) exposure to other viral communicable diseases: Secondary | ICD-10-CM | POA: Diagnosis not present

## 2023-12-08 ENCOUNTER — Encounter: Payer: Self-pay | Admitting: Gastroenterology

## 2023-12-08 ENCOUNTER — Ambulatory Visit (INDEPENDENT_AMBULATORY_CARE_PROVIDER_SITE_OTHER): Payer: Medicare Other | Admitting: Gastroenterology

## 2023-12-08 VITALS — BP 118/68 | HR 58 | Ht 72.0 in | Wt 162.2 lb

## 2023-12-08 DIAGNOSIS — K5904 Chronic idiopathic constipation: Secondary | ICD-10-CM | POA: Diagnosis not present

## 2023-12-08 DIAGNOSIS — Z860100 Personal history of colon polyps, unspecified: Secondary | ICD-10-CM | POA: Diagnosis not present

## 2023-12-08 DIAGNOSIS — D490 Neoplasm of unspecified behavior of digestive system: Secondary | ICD-10-CM

## 2023-12-08 MED ORDER — SUTAB 1479-225-188 MG PO TABS: ORAL_TABLET | ORAL | 0 refills | Status: DC

## 2023-12-08 MED ORDER — SUFLAVE 178.7 G PO SOLR: 1.0000 | Freq: Once | ORAL | 0 refills | Status: DC

## 2023-12-08 NOTE — Progress Notes (Signed)
 Chief Complaint:recall colonoscopy Primary GI Doctor:Dr. Rhea Belton  HPI:  78 y.o. male  with a past medical history of hyperlipidemia, adenomatous polyps, anxiety and others listed below, returns to clinic today for recall colonoscopy.   01/19/2021 colonoscopy with Dr. Rhea Belton for personal history of adenomatous polyps. 4 sessile serrated polyps transverse colon measuring 4-9 mm in size, internal hemorrhoids small-recall 3 years 12/23/2015 colonoscopy-  with Dr. Madilyn Fireman. One 5 mm poylp in the rectum, sessile polyp. Otherwise normal exam. 04/15/2016 endoscopy with Dr. Madilyn Fireman at [facility] small hiatal hernia, gastritis, normal duodenum.  ---02/23/22 patient last seen in the office by [facility] for chronic constipation and left inguinal hernia.  Patient was started on MiraLAX and Benefiber.  Referral to general surgery for hernia and u/s ordered. 03/02/22 US Pelvis IMPRESSION: Small fat containing left inguinal hernia.  Loop of small bowel in the left anterior lower pelvis along the inguinal canal does not appear to extend through a fascial defect but does correspond with the patient's palpable area of concern, consider further evaluation with contrasted CT. 03/25/22 CT A/P W Contrast IMPRESSION: 1. Small fat containing left inguinal hernia, no bowel containing hernia on this exam. 2. No CT evidence for acute intra-abdominal or pelvic abnormality 09/13/22 MR ABD W WO Contrast IMPRESSION: 1. T2 hyperintense 12 mm splenic lesion demonstrates early arterial enhancement with persists throughout delayed postcontrast pulse sequences similar in intensity to background aorta, consistent with a benign finding likely reflecting a benign splenic hemangioma. 2. T2 bright spots bright spots in the pancreas measure up to 4 mm without suspicious MRI features, likely reflect small side branch IPMNs. Recommend follow up pre and post contrast MRI/MRCP in 2 years. If stable on that examination these could safely  be considered benign and would require no additional follow-up.   Interval History   Patient presents for follow-up on recall colonoscopy. He is currently not having any gastrointestinal issues. Patient has history of chronic constipation that is well managed with daily Miralax. Patient denies abdominal pain or rectal bleeding. Patient taking baby ASA 81 mg po daily.   Wt Readings from Last 3 Encounters:  04/07/22 172 lb 12.8 oz (78.4 kg)  02/23/22 174 lb (78.9 kg)  01/19/21 172 lb (78 kg)    Past Medical History:  Diagnosis Date   Anxiety    Arthritis    Chronic back pain    Colon polyps    Gout    Heart murmur    Childhood   History of hiatal hernia    Hypercholesteremia     Past Surgical History:  Procedure Laterality Date   colonscopy     several   EXCISION MASS UPPER EXTREMETIES Right 09/09/2015   Procedure: EXCISION MASS UPPER EXTREMETIES;  Surgeon: Darnell Level, MD;  Location: [facility];  Service: General;  Laterality: Right;   EYE SURGERY Bilateral    cataract   FOOT SURGERY Left    Achilles tendon   INGUINAL HERNIA REPAIR N/A 04/07/2022   Procedure: LAPAROSCOPIC BILATERAL INGUINAL HERNIA REPAIR WITH MESH, UMBILICAL HERNIA , TAP BLOCK;  Surgeon: Karie Soda, MD;  Location: [facility];  Service: General;  Laterality: N/A;   KNEE SURGERY Right    meniscus repair   left thumb surgery  yrs ago   Lymphoepithelial Cyst  Throat   MASS EXCISION Left 09/09/2015   Procedure: EXCISION SOFT TISSUE MASS LEFT FLANK ;  Surgeon: Darnell Level, MD;  Location: [facility];  Service: General;  Laterality: Left;   MEDIAL COLLATERAL LIGAMENT REPAIR, ELBOW  Right    NASAL ENDOSCOPY  25 yrs ago   ORIF CLAVICULAR FRACTURE Left 02/04/2017   Procedure: OPEN REDUCTION INTERNAL FIXATION (ORIF) CLAVICULAR FRACTURE;  Surgeon: Beverely Low, MD;  Location: [facility] OR;  Service: Orthopedics;  Laterality: Left;   QUADRICEPS TENDON REPAIR Left 02/04/2017   Procedure: REPAIR QUADRICEP TENDON;  Surgeon: Beverely Low, MD;  Location: [facility] OR;  Service: Orthopedics;  Laterality: Left;   ROTATOR CUFF REPAIR Right 1998   UMBILICAL HERNIA REPAIR N/A 04/07/2022   Procedure: HERNIA REPAIR UMBILICAL ADULT;  Surgeon: Karie Soda, MD;  Location: [facility];  Service: General;  Laterality: N/A;    Current Outpatient Medications  Medication Sig Dispense Refill   allopurinol (ZYLOPRIM) 300 MG tablet Take 300 mg by mouth in the morning.     aspirin EC 81 MG tablet Take 81 mg by mouth at bedtime.     atorvastatin (LIPITOR) 40 MG tablet Take 40 mg by mouth at bedtime.     cholecalciferol (VITAMIN D) 1000 UNITS tablet Take 1,000 Units by mouth in the morning.     melatonin 3 MG TABS tablet Take 3 mg by mouth at bedtime.     sertraline (ZOLOFT) 100 MG tablet Take 100 mg by mouth at bedtime.     sildenafil (VIAGRA) 50 MG tablet Take 50 mg by mouth daily as needed for erectile dysfunction.     traMADol (ULTRAM) 50 MG tablet Take 1-2 tablets (50-100 mg total) by mouth every 6 (six) hours as needed for moderate pain or severe pain. 20 tablet 0   Current Facility-Administered Medications  Medication Dose Route Frequency Provider Last Rate Last Admin   0.9 %  sodium chloride infusion  500 mL Intravenous Once Pyrtle, Carie Caddy, MD        Allergies as of 12/08/2023   (No Known Allergies)    Family History  Problem Relation Age of Onset   Breast cancer Mother    Lung cancer Father    Colon cancer Neg Hx    Esophageal cancer Neg Hx    Rectal cancer Neg Hx    Stomach cancer Neg Hx     Review of Systems:    Constitutional: No weight loss, fever, chills, weakness or fatigue HEENT: Eyes: No change in vision               Ears, Nose, Throat:  No change in hearing or congestion Skin: No rash or itching Cardiovascular: No chest pain, chest pressure or palpitations   Respiratory: No SOB or cough Gastrointestinal: See HPI and otherwise negative Genitourinary: No dysuria or change in urinary frequency Neurological: No  headache, dizziness or syncope Musculoskeletal: No new muscle or joint pain Hematologic: No bleeding or bruising Psychiatric: No history of depression or anxiety    Physical Exam:  Vital signs: There were no vitals taken for this visit.  Constitutional:   Pleasant male appears to be in NAD, Well developed, Well nourished, alert and cooperative Throat: Oral cavity and pharynx without inflammation, swelling or lesion.  Respiratory: Respirations even and unlabored. Lungs clear to auscultation bilaterally.   No wheezes, crackles, or rhonchi.  Cardiovascular: Normal S1, S2. Regular rate and rhythm. No peripheral edema, cyanosis or pallor.  Gastrointestinal:  Soft, nondistended, nontender. No rebound or guarding. Normal bowel sounds. No appreciable masses or hepatomegaly. Rectal:  Not performed.  Msk:  Symmetrical without gross deformities. Without edema, no deformity or joint abnormality.  Neurologic:  Alert and  oriented x4;  grossly normal neurologically.  Skin:   Dry and intact without significant lesions or rashes. Psychiatric: Oriented to person, place and time. Demonstrates good judgement and reason without abnormal affect or behaviors.  RELEVANT LABS AND IMAGING: CBC    Latest Ref Rng & Units 02/10/2017   11:05 AM 02/04/2017    5:23 PM 02/04/2017   11:15 AM  CBC  WBC 3.4 - 10.8 x10E3/uL 7.8  10.7  8.4   Hemoglobin 13.0 - 17.7 g/dL 16.1  09.6  04.5   Hematocrit 37.5 - 51.0 % 33.2  38.1  38.8   Platelets 150 - 379 x10E3/uL 266  143  157      CMP     Latest Ref Rng & Units 02/10/2017   11:05 AM 02/04/2017    5:23 PM 10/19/2012    1:26 PM  CMP  Glucose 65 - 99 mg/dL 409   93   BUN 8 - 27 mg/dL 19   17   Creatinine 8.11 - 1.27 mg/dL 9.14  7.82  9.56   Sodium 134 - 144 mmol/L 138   144   Potassium 3.5 - 5.2 mmol/L 4.0   4.9   Chloride 96 - 106 mmol/L 97   104   CO2 18 - 29 mmol/L 25   30   Calcium 8.6 - 10.2 mg/dL 9.2   21.3   Total Protein 6.0 - 8.3 g/dL   7.3   Total  Bilirubin 0.3 - 1.2 mg/dL   0.5   Alkaline Phos 39 - 117 U/L   74   AST 0 - 37 U/L   34   ALT 0 - 53 U/L   39      Assessment: Encounter Diagnoses  Name Primary?   IPMN (intraductal papillary mucinous neoplasm) Yes   Chronic idiopathic constipation    History of colonic polyps    78 year old male patient with history of colonic polyps, due for colonoscopy, will schedule today with Dr. Rhea Belton in [facility]. No gastrointestinal complaints at this time. His constipation is managed with daily Miralax.       For his IPMN incidentally found on MRI in 12/23 it was recommended by radiology to have follow up pre and post contrast MRI/MRCP in 2 years which would be this December. If stable on that examination these could safely be considered benign and would require no additional follow-up.  Plan: -MRI/MRCP Abdomen recall for IPMN 08/2024 - Schedule a colonoscopy in [facility] with Dr. Rhea Belton. The risks and benefits of colonoscopy with possible polypectomy / biopsies were discussed and the patient agrees to proceed.  Patient has tortuous colon- required pediatric tube in past, will put on notes.   Thank you for the courtesy of this consult. Please call me with any questions or concerns.   Chau Savell, FNP-C [facility] 12/08/2023, 7:54 AM  Cc: Gweneth Dimitri, MD

## 2023-12-08 NOTE — Patient Instructions (Signed)
 You have been scheduled for a colonoscopy. Please follow written instructions given to you at your visit today.   If you use inhalers (even only as needed), please bring them with you on the day of your procedure.  DO NOT TAKE 7 DAYS PRIOR TO TEST- Trulicity (dulaglutide) Ozempic, Wegovy (semaglutide) Mounjaro (tirzepatide) Bydureon Bcise (exanatide extended release)  DO NOT TAKE 1 DAY PRIOR TO YOUR TEST Rybelsus (semaglutide) Adlyxin (lixisenatide) Victoza (liraglutide) Byetta (exanatide) ___________________________________________________________________________  Drew Wyatt will receive your bowel preparation through [facility], which ensures the lowest copay and home delivery, with outreach via text or call from an [phone] number. Please respond promptly to avoid rescheduling of your procedure. If you are interested in alternative options or have any questions regarding your prep, please contact them at [phone] ____________________________________________________________________________  Your Provider Has Sent Your Bowel Prep Regimen To [facility]   [facility] will contact you to verify your information and collect your copay, if applicable. Enjoy the comfort of your home while your prescription is mailed to you, FREE of any shipping charges.   [facility] accepts all major insurance benefits and applies discounts & coupons.  Have additional questions?   Chat: [email or web address] Call: [phone] Email: [email or web address] NCPDP: [id number]  How will [facility] contact you?  With a Welcome phone call,  a Welcome text and a checkout link in text form.  Texts you receive from [phone] Are NOT Spam.  *To set up delivery, you must complete the checkout process via link or speak to one of the patient care representatives. If [facility] is unable to reach you, your prescription may be delayed.  To avoid long hold times on the phone, you may also utilize the secure chat  feature on the [facility] website to request that they call you back for transaction completion or to expedite your concerns.  Due to recent changes in healthcare laws, you may see the results of your imaging and laboratory studies on MyChart before your provider has had a chance to review them.  We understand that in some cases there may be results that are confusing or concerning to you. Not all laboratory results come back in the same time frame and the provider may be waiting for multiple results in order to interpret others.  Please give [place] 48 hours in order for your provider to thoroughly review all the results before contacting the office for clarification of your results.   _______________________________________________________  If your blood pressure at your visit was 140/90 or greater, please contact your primary care physician to follow up on this.  _______________________________________________________  If you are age 79 or older, your body mass index should be between 23-30. Your Body mass index is 22.01 kg/m. If this is out of the aforementioned range listed, please consider follow up with your Primary Care Provider.  If you are age 79 or younger, your body mass index should be between 19-25. Your Body mass index is 22.01 kg/m. If this is out of the aformentioned range listed, please consider follow up with your Primary Care Provider.   ________________________________________________________  The [facility] providers would like to encourage you to use [facility] to communicate with providers for non-urgent requests or questions.  Due to long hold times on the telephone, sending your provider a message by [facility] may be a faster and more efficient way to get a response.  Please allow 48 business hours for a response.  Please remember that this is for non-urgent requests.  _______________________________________________________ Thank you for trusting me  with your gastrointestinal  care!   Deanna J May,NP

## 2023-12-26 ENCOUNTER — Ambulatory Visit (AMBULATORY_SURGERY_CENTER): Admitting: Internal Medicine

## 2023-12-26 ENCOUNTER — Encounter: Payer: Self-pay | Admitting: Internal Medicine

## 2023-12-26 VITALS — BP 130/70 | HR 46 | Temp 97.9°F | Resp 11 | Ht 72.0 in | Wt 162.0 lb

## 2023-12-26 DIAGNOSIS — Q438 Other specified congenital malformations of intestine: Secondary | ICD-10-CM | POA: Diagnosis not present

## 2023-12-26 DIAGNOSIS — Z860100 Personal history of colon polyps, unspecified: Secondary | ICD-10-CM

## 2023-12-26 DIAGNOSIS — Z860101 Personal history of adenomatous and serrated colon polyps: Secondary | ICD-10-CM | POA: Diagnosis not present

## 2023-12-26 DIAGNOSIS — D125 Benign neoplasm of sigmoid colon: Secondary | ICD-10-CM

## 2023-12-26 DIAGNOSIS — Z1211 Encounter for screening for malignant neoplasm of colon: Secondary | ICD-10-CM

## 2023-12-26 DIAGNOSIS — Q859 Phakomatosis, unspecified: Secondary | ICD-10-CM | POA: Diagnosis not present

## 2023-12-26 DIAGNOSIS — D122 Benign neoplasm of ascending colon: Secondary | ICD-10-CM | POA: Diagnosis not present

## 2023-12-26 DIAGNOSIS — K635 Polyp of colon: Secondary | ICD-10-CM | POA: Diagnosis not present

## 2023-12-26 MED ORDER — SODIUM CHLORIDE 0.9 % IV SOLN: 500.0000 mL | Freq: Once | INTRAVENOUS | Status: DC

## 2023-12-26 NOTE — Patient Instructions (Signed)

## 2023-12-26 NOTE — Progress Notes (Signed)
 Addendum: Reviewed and agree with assessment and management plan. Asha Grumbine, Carie Caddy, MD

## 2023-12-26 NOTE — Progress Notes (Signed)
 Pt's states no medical or surgical changes since previsit or office visit.

## 2023-12-26 NOTE — Progress Notes (Signed)
 Pt resting comfortably. VSS. Airway intact. SBAR complete to RN. All questions answered.

## 2023-12-26 NOTE — Progress Notes (Signed)
 Called to room to assist during endoscopic procedure.  Patient ID and intended procedure confirmed with present staff. Received instructions for my participation in the procedure from the performing physician.

## 2023-12-26 NOTE — Op Note (Signed)
 [facility] Patient Name: Drew Wyatt Procedure Date: 12/26/2023 1:29 PM MRN: [id number] Endoscopist: Beverley Fiedler , MD, [id number] Age: 78 Referring MD:  Date of Birth: 12/01/1945 Gender: Male Account #: [id number] Procedure:                Colonoscopy Indications:              High risk colon cancer surveillance: Personal                            history of sessile serrated colon polyp (less than                            10 mm in size) with no dysplasia, Last colonoscopy:                            April 2022 (SSP x 4) Medicines:                Monitored Anesthesia Care Procedure:                Pre-Anesthesia Assessment:                           - Prior to the procedure, a History and Physical                            was performed, and patient medications and                            allergies were reviewed. The patient's tolerance of                            previous anesthesia was also reviewed. The risks                            and benefits of the procedure and the sedation                            options and risks were discussed with the patient.                            All questions were answered, and informed consent                            was obtained. Prior Anticoagulants: The patient has                            taken no anticoagulant or antiplatelet agents. ASA                            Grade Assessment: I - A normal, healthy patient.                            After reviewing the risks and benefits, the patient  was deemed in satisfactory condition to undergo the                            procedure.                           After obtaining informed consent, the colonoscope                            was passed under direct vision. Throughout the                            procedure, the patient's blood pressure, pulse, and                            oxygen saturations were monitored continuously. The                             CF [id number] #[id number] was introduced through the anus                            and advanced to the cecum, identified by its                            appearance. The colonoscopy was performed without                            difficulty. The patient tolerated the procedure                            well. The quality of the bowel preparation was                            good. The ileocecal valve, appendiceal orifice, and                            rectum were photographed. Scope In: 1:39:40 PM Scope Out: 2:04:33 PM Scope Withdrawal Time: 0 hours 16 minutes 0 seconds  Total Procedure Duration: 0 hours 24 minutes 53 seconds  Findings:                 The digital rectal exam was normal.                           A 5 mm polyp was found in the ascending colon. The                            polyp was sessile. The polyp was removed with a                            cold snare. Resection and retrieval were complete.                           Two sessile polyps were found in the sigmoid colon.  The polyps were 3 to 4 mm in size. These polyps                            were removed with a cold snare. Resection and                            retrieval were complete.                           The sigmoid colon, descending colon and transverse                            colon were significantly redundant.                           The retroflexed view of the distal rectum and anal                            verge was normal and showed no anal or rectal                            abnormalities. Complications:            No immediate complications. Estimated Blood Loss:     Estimated blood loss was minimal. Impression:               - One 5 mm polyp in the ascending colon, removed                            with a cold snare. Resected and retrieved.                           - Two 3 to 4 mm polyps in the sigmoid colon,                             removed with a cold snare. Resected and retrieved.                           - Redundant colon.                           - The distal rectum and anal verge are normal on                            retroflexion view. Recommendation:           - Patient has a contact number available for                            emergencies. The signs and symptoms of potential                            delayed complications were discussed with the  patient. Return to normal activities tomorrow.                            Written discharge instructions were provided to the                            patient.                           - Resume previous diet.                           - Continue present medications.                           - Await pathology results.                           - No recommendation at this time regarding repeat                            colonoscopy due to age at next surveillance                            interval. Beverley Fiedler, MD 12/26/2023 2:09:20 PM This report has been signed electronically.

## 2023-12-26 NOTE — Progress Notes (Signed)
 See office note dated 12/08/2023 for details and current H&P  Patient presenting for colonoscopy to follow-up SSP removed at last colonoscopy  He remains appropriate for [facility] colonoscopy today

## 2023-12-27 ENCOUNTER — Telehealth: Payer: Self-pay

## 2023-12-27 NOTE — Telephone Encounter (Signed)
 Follow up call to pt, no answer.

## 2023-12-29 ENCOUNTER — Encounter: Payer: Self-pay | Admitting: Internal Medicine

## 2023-12-29 LAB — SURGICAL PATHOLOGY

## 2024-01-03 ENCOUNTER — Encounter: Payer: Self-pay | Admitting: Internal Medicine

## 2024-01-05 NOTE — Telephone Encounter (Signed)
 Recall has been entered in EPIC for 12/2028 for colonoscopy/office visit prior to discuss.

## 2024-01-05 NOTE — Telephone Encounter (Signed)
 Please place a 5-year recall for colonoscopy for this patient but with office visit first

## 2024-01-09 DIAGNOSIS — Z23 Encounter for immunization: Secondary | ICD-10-CM | POA: Diagnosis not present

## 2024-01-25 DIAGNOSIS — E782 Mixed hyperlipidemia: Secondary | ICD-10-CM | POA: Diagnosis not present

## 2024-01-25 DIAGNOSIS — F418 Other specified anxiety disorders: Secondary | ICD-10-CM | POA: Diagnosis not present

## 2024-03-20 DIAGNOSIS — L723 Sebaceous cyst: Secondary | ICD-10-CM | POA: Diagnosis not present

## 2024-03-20 DIAGNOSIS — L821 Other seborrheic keratosis: Secondary | ICD-10-CM | POA: Diagnosis not present

## 2024-03-20 DIAGNOSIS — L905 Scar conditions and fibrosis of skin: Secondary | ICD-10-CM | POA: Diagnosis not present

## 2024-03-20 DIAGNOSIS — D225 Melanocytic nevi of trunk: Secondary | ICD-10-CM | POA: Diagnosis not present

## 2024-03-20 DIAGNOSIS — L281 Prurigo nodularis: Secondary | ICD-10-CM | POA: Diagnosis not present

## 2024-03-20 DIAGNOSIS — D045 Carcinoma in situ of skin of trunk: Secondary | ICD-10-CM | POA: Diagnosis not present

## 2024-03-20 DIAGNOSIS — L308 Other specified dermatitis: Secondary | ICD-10-CM | POA: Diagnosis not present

## 2024-03-20 DIAGNOSIS — D485 Neoplasm of uncertain behavior of skin: Secondary | ICD-10-CM | POA: Diagnosis not present

## 2024-03-20 DIAGNOSIS — L820 Inflamed seborrheic keratosis: Secondary | ICD-10-CM | POA: Diagnosis not present

## 2024-03-26 DIAGNOSIS — E782 Mixed hyperlipidemia: Secondary | ICD-10-CM | POA: Diagnosis not present

## 2024-03-26 DIAGNOSIS — F418 Other specified anxiety disorders: Secondary | ICD-10-CM | POA: Diagnosis not present

## 2024-03-29 DIAGNOSIS — D045 Carcinoma in situ of skin of trunk: Secondary | ICD-10-CM | POA: Diagnosis not present

## 2024-04-26 DIAGNOSIS — E782 Mixed hyperlipidemia: Secondary | ICD-10-CM | POA: Diagnosis not present

## 2024-04-26 DIAGNOSIS — F418 Other specified anxiety disorders: Secondary | ICD-10-CM | POA: Diagnosis not present

## 2024-05-27 DIAGNOSIS — E782 Mixed hyperlipidemia: Secondary | ICD-10-CM | POA: Diagnosis not present

## 2024-05-27 DIAGNOSIS — F418 Other specified anxiety disorders: Secondary | ICD-10-CM | POA: Diagnosis not present

## 2024-06-15 DIAGNOSIS — Z23 Encounter for immunization: Secondary | ICD-10-CM | POA: Diagnosis not present

## 2024-06-26 DIAGNOSIS — E782 Mixed hyperlipidemia: Secondary | ICD-10-CM | POA: Diagnosis not present

## 2024-06-26 DIAGNOSIS — F418 Other specified anxiety disorders: Secondary | ICD-10-CM | POA: Diagnosis not present

## 2024-06-28 DIAGNOSIS — Z23 Encounter for immunization: Secondary | ICD-10-CM | POA: Diagnosis not present

## 2024-07-09 ENCOUNTER — Other Ambulatory Visit: Payer: Self-pay

## 2024-07-09 ENCOUNTER — Telehealth: Payer: Self-pay | Admitting: Internal Medicine

## 2024-07-09 DIAGNOSIS — R9389 Abnormal findings on diagnostic imaging of other specified body structures: Secondary | ICD-10-CM

## 2024-07-09 DIAGNOSIS — D7389 Other diseases of spleen: Secondary | ICD-10-CM

## 2024-07-09 NOTE — Telephone Encounter (Signed)
 Inbound call from patient stating he would like to be scheduled for an open MRI. Patient had MRI done last year and stated they informed him to come back in a year and that's what he's doing.  Requesting a call back Please advise  Thank you

## 2024-07-09 NOTE — Telephone Encounter (Signed)
 Order for MRI abd placed to be done at [facility] for open mri. Pt knows to expect a call to schedule the MRI. He was instructed to call back if he does not hear for them within a week.

## 2024-07-27 DIAGNOSIS — F418 Other specified anxiety disorders: Secondary | ICD-10-CM | POA: Diagnosis not present

## 2024-07-27 DIAGNOSIS — E782 Mixed hyperlipidemia: Secondary | ICD-10-CM | POA: Diagnosis not present

## 2024-08-06 NOTE — Telephone Encounter (Signed)
 PT requesting a call back about MRI details. He wants to make sure that the MRI will be done for the correct reasons. He wants to have it look for a hernia. Please advise.

## 2024-08-06 NOTE — Telephone Encounter (Signed)
 Spoke with pt and let her know he is having an mri of abd/mrcp done to follow-up on place in pancreas. Pt wanted to know if we could add on his leg as he thinks he may have a hernia. Discussed with him he should discuss this with pcp or surgeon as that is not something GI would handle. He verbalized understanding.

## 2024-08-07 DIAGNOSIS — M542 Cervicalgia: Secondary | ICD-10-CM | POA: Diagnosis not present

## 2024-08-07 DIAGNOSIS — R2241 Localized swelling, mass and lump, right lower limb: Secondary | ICD-10-CM | POA: Diagnosis not present

## 2024-08-10 ENCOUNTER — Ambulatory Visit
Admission: RE | Admit: 2024-08-10 | Discharge: 2024-08-10 | Disposition: A | Discharge status: Home / Self Care | Source: Ambulatory Visit | Attending: Physician Assistant | Admitting: Physician Assistant

## 2024-08-10 DIAGNOSIS — D7389 Other diseases of spleen: Secondary | ICD-10-CM

## 2024-08-10 DIAGNOSIS — K7689 Other specified diseases of liver: Secondary | ICD-10-CM | POA: Diagnosis not present

## 2024-08-10 DIAGNOSIS — N281 Cyst of kidney, acquired: Secondary | ICD-10-CM | POA: Diagnosis not present

## 2024-08-10 DIAGNOSIS — R9389 Abnormal findings on diagnostic imaging of other specified body structures: Secondary | ICD-10-CM

## 2024-08-10 MED ORDER — GADOPICLENOL 0.5 MMOL/ML IV SOLN
7.5000 mL | Freq: Once | INTRAVENOUS | Status: AC | PRN
  Administered 2024-08-10: 7.5 mL via INTRAVENOUS

## 2024-08-13 ENCOUNTER — Ambulatory Visit: Payer: Self-pay | Admitting: Physician Assistant

## 2024-08-13 ENCOUNTER — Other Ambulatory Visit: Payer: Self-pay | Admitting: Family Medicine

## 2024-08-13 DIAGNOSIS — R2241 Localized swelling, mass and lump, right lower limb: Secondary | ICD-10-CM

## 2024-08-16 ENCOUNTER — Ambulatory Visit
Admission: RE | Admit: 2024-08-16 | Discharge: 2024-08-16 | Disposition: A | Discharge status: Home / Self Care | Source: Ambulatory Visit | Attending: Family Medicine | Admitting: Family Medicine

## 2024-08-16 DIAGNOSIS — D360 Benign neoplasm of lymph nodes: Secondary | ICD-10-CM | POA: Diagnosis not present

## 2024-08-16 DIAGNOSIS — R2241 Localized swelling, mass and lump, right lower limb: Secondary | ICD-10-CM

## 2024-08-26 DIAGNOSIS — F418 Other specified anxiety disorders: Secondary | ICD-10-CM | POA: Diagnosis not present

## 2024-08-26 DIAGNOSIS — E782 Mixed hyperlipidemia: Secondary | ICD-10-CM | POA: Diagnosis not present

## 2024-09-10 DIAGNOSIS — E782 Mixed hyperlipidemia: Secondary | ICD-10-CM | POA: Diagnosis not present

## 2024-09-10 DIAGNOSIS — R590 Localized enlarged lymph nodes: Secondary | ICD-10-CM | POA: Diagnosis not present

## 2024-09-10 DIAGNOSIS — Z125 Encounter for screening for malignant neoplasm of prostate: Secondary | ICD-10-CM | POA: Diagnosis not present

## 2024-09-10 DIAGNOSIS — R7301 Impaired fasting glucose: Secondary | ICD-10-CM | POA: Diagnosis not present

## 2024-09-10 DIAGNOSIS — M1000 Idiopathic gout, unspecified site: Secondary | ICD-10-CM | POA: Diagnosis not present
